# Patient Record
Sex: Female | Born: 1965 | Race: White | Hispanic: No | Marital: Married | Smoking: Former smoker
Health system: Southern US, Community
[De-identification: ages and names within clinical notes are randomized; demographics above are authoritative.]

## PROBLEM LIST (undated history)

## (undated) DIAGNOSIS — E119 Type 2 diabetes mellitus without complications: Secondary | ICD-10-CM

## (undated) DIAGNOSIS — I2510 Atherosclerotic heart disease of native coronary artery without angina pectoris: Secondary | ICD-10-CM

---

## 2000-12-06 HISTORY — PX: PERCUTANEOUS CORONARY STENT INTERVENTION (PCI-S): SHX6016

## 2001-02-15 ENCOUNTER — Encounter: Admission: RE | Admit: 2001-02-15 | Discharge: 2001-02-15 | Payer: Self-pay | Admitting: Otolaryngology

## 2001-02-15 ENCOUNTER — Encounter: Payer: Self-pay | Admitting: Otolaryngology

## 2005-01-17 ENCOUNTER — Emergency Department (HOSPITAL_COMMUNITY): Admission: EM | Admit: 2005-01-17 | Discharge: 2005-01-17 | Payer: Self-pay | Admitting: Emergency Medicine

## 2009-03-10 ENCOUNTER — Inpatient Hospital Stay (HOSPITAL_COMMUNITY): Admission: RE | Admit: 2009-03-10 | Discharge: 2009-03-15 | Payer: Self-pay | Admitting: Neurosurgery

## 2011-03-17 LAB — CBC
MCHC: 34.3 g/dL (ref 30.0–36.0)
MCV: 93 fL (ref 78.0–100.0)
RBC: 3.38 MIL/uL — ABNORMAL LOW (ref 3.87–5.11)
RBC: 3.44 MIL/uL — ABNORMAL LOW (ref 3.87–5.11)
WBC: 10.3 10*3/uL (ref 4.0–10.5)
WBC: 10.8 10*3/uL — ABNORMAL HIGH (ref 4.0–10.5)

## 2011-03-17 LAB — BASIC METABOLIC PANEL
BUN: 6 mg/dL (ref 6–23)
CO2: 31 mEq/L (ref 19–32)
Calcium: 9.3 mg/dL (ref 8.4–10.5)
Chloride: 101 mEq/L (ref 96–112)
Creatinine, Ser: 0.81 mg/dL (ref 0.4–1.2)
GFR calc Af Amer: >60 mL/min (ref >60)
GFR calc non Af Amer: >60 mL/min (ref >60)
Glucose, Bld: 98 mg/dL (ref 70–99)
Potassium: 3.7 mEq/L (ref 3.5–5.1)
Sodium: 139 mEq/L (ref 135–145)

## 2011-03-18 LAB — CBC
HCT: 43.2 % (ref 36.0–46.0)
Hemoglobin: 14.9 g/dL (ref 12.0–15.0)
MCHC: 34.4 g/dL (ref 30.0–36.0)
MCV: 93.5 fL (ref 78.0–100.0)
RBC: 4.62 MIL/uL (ref 3.87–5.11)

## 2011-03-18 LAB — BASIC METABOLIC PANEL
CO2: 26 mEq/L (ref 19–32)
Chloride: 102 mEq/L (ref 96–112)
Creatinine, Ser: 1.06 mg/dL (ref 0.4–1.2)
GFR calc Af Amer: >60 mL/min (ref >60)
Sodium: 136 mEq/L (ref 135–145)

## 2011-04-20 NOTE — Op Note (Signed)
NAME:  Kristin [name], Kristin [name] NO.:  [id number]   MEDICAL RECORD NO.:  [id number]          PATIENT TYPE:  INP   LOCATION:  3029                         FACILITY:  [facility]   PHYSICIAN:  Cristi Loron, M.D.DATE OF BIRTH:  [birth year]   DATE OF PROCEDURE:  03/10/2009  DATE OF DISCHARGE:                               OPERATIVE REPORT   BRIEF HISTORY:  The patient is a 45 year old white female who has  suffered from back and leg pain consistent with neurogenic claudication.  She failed medical management was worked up with a lumbar MRI which  demonstrated the patient had severe disk degeneration and stenosis at L4-  5.  I discussed the various treatment options with the patient including  surgery.  She has weighed the risks, benefits and alternatives of  surgery and decided to proceed with L4-5 decompression, instrumentation  and fusion.   PREOPERATIVE DIAGNOSES:  L4-5 degenerative disk disease, spinal  stenosis, lumbar radiculopathy and lumbago.   POSTOPERATIVE DIAGNOSES:  L4-5 degenerative disk disease, spinal  stenosis, lumbar radiculopathy and lumbago.   PROCEDURES:  Bilateral L4 laminotomies and foraminotomies to decompress  the bilateral L4 as well as the L5 nerve roots; L4-5 posterior lumbar  interbody fusion with local morselized autograft bone and Actifuse bone  graft extender; insertion of L4-5 interbody prosthesis (Capstone PEEK  interbody prosthesis); L4-5 posterior nonsegmental instrumentation with  Legacy titanium screws and rods; L4-5 posterolateral arthrodesis with  local morselized autograft bone and Vitoss bone graft extender.   SURGEON:  Cristi Loron, MD   ASSISTANT:  Coletta Memos, MD   ANESTHESIA:  General endotracheal.   ESTIMATED BLOOD LOSS:  200 mL.   SPECIMENS:  None.   DRAINS:  None.   COMPLICATIONS:  None.   DESCRIPTION OF PROCEDURE:  The patient was brought to the operating room  by the Anesthesia Team.  General endotracheal  anesthesia was induced.  The patient was turned to the prone position on the Wilson frame.  The  lumbosacral region was then prepared with Betadine scrub and Betadine  solution.  Sterile drapes were applied.  I then injected the area to be  incised with Marcaine with epinephrine solution, used a scalpel to make  a linear midline incision over the L4-5 interspace.  I used the  electrocautery performing bilateral subperiosteal dissection exposing  the spinous process of L3, L4 and L5.  We obtained intraoperative  radiograph to confirm our location and then inserted the [facility]  retractor for exposure.   We began the decompression by performing bilateral L4 laminotomies.  I  should mention that because of the patient's severe facet arthropathy  and lateral recess/foraminal stenosis, a wide lateral decompression was  required in excess of what was the work needed to be done for posterior  lumbar interbody fusion, i.e. we performed medial facetectomies and wide  foraminotomies about the bilateral L4 and L5 nerve roots.  This  completed the decompression.   We now turned our attention to the posterior lumbar interbody fusion,  incised the L4-5 intervertebral disk bilaterally with a 15 blade  scalpel.  We performed a partial  intervertebral dissection with the  pituitary forceps and Carlens curettes.  Disk space was collapsed and  spondylotic but we were able to distract it somewhat.  We then used  trial spacers and determined to use 8 x 26 mm Capstone PEEK interbody  prosthesis.  We prefilled the prosthesis with Vitoss bone graft center  as well as local morselized autograft bone.  We obtained the  decompression.  We then inserted the prosthesis into the interspace of  course after retracting the neural structures out of harm's way.  I  should also mention that we filled the remainder of the disk space with  Vitoss bone graft extender, this completed the posterior lumbar  interbody  fusion.   We now turned our attention to the posterior instrumentation.  Under  fluoroscopic guidance, we cannulated the bilateral L4 and L5 pedicles  with a bone probe.  We tapped the pedicles with 5.5 mm tap.  We then  probed inside the tapped pedicles, there were no cortical breeches and  then we inserted a 6.5 x 45 mm tap screws bilaterally at L4-L5 under  fluoroscopic guidance.  We then palpated along the medial aspect of the  L4-L5 pedicles and noticed no cortical breeches and nerve roots were not  injured.  We then connected the unilateral pedicle screw with lordotic  rod.  We compressed, construct and then secured the rod in place with  caps which we tightened appropriately.  This completed the  instrumentation.   We now turned our attention to the posterolateral arthrodesis.  We used  high-speed drill to decorticate the remainder of the L4 pars, the L4-L5  transverse process and the upper L5 lamina.  We then laid a combination  of local morselized autograft bone and Vitoss bone graft extenders over  these corticated posterolateral structures completing the posterolateral  arthrodesis..   We then inspected the thecal sac in bilateral L4 and L5 nerve roots and  noted it well decompressed.  We obtained hemostasis using bipolar  electrocautery.  We irrigated the wound out with bacitracin solution,  then removed the retractor and then reapproximated the patient's  thoracolumbar fascia with interrupted #1 Vicryl suture, subcutaneous  tissue with interrupted 2-0 Vicryl suture and the skin with Steri-Strips  and benzoin.  The wound was then coated with bacitracin ointment.  Sterile dressing was applied.  The drapes were removed and the patient  was subsequently returned to the supine position where she was extubated  by the Anesthesia Team and transported to the postanesthesia care unit  in stable condition.  All sponge, instrument and needle counts were  correct at the end of this  case.      Cristi Loron, M.D.  Electronically Signed     JDJ/MEDQ  D:  03/10/2009  T:  03/11/2009  Job:  [id number]

## 2011-04-20 NOTE — Discharge Summary (Signed)
NAMEEMMAKATE, HYPES NO.:  [id number]   MEDICAL RECORD NO.:  [id number]          PATIENT TYPE:  INP   LOCATION:  3029                         FACILITY:  [facility]   PHYSICIAN:  Coletta Memos, M.D.     DATE OF BIRTH:  06/27/1966   DATE OF ADMISSION:  03/10/2009  DATE OF DISCHARGE:  03/15/2009                               DISCHARGE SUMMARY   ADMITTING DIAGNOSIS:  L4-5 degenerative disk disease and stenosis.   DISCHARGE DIAGNOSIS:  L4-5 degenerative disk disease and stenosis.   PROCEDURE:  L4-5 posterior lumbar interbody arthrodesis with bilateral  cages.   INDICATIONS:  Ms. Whitehurst presented to Dr. Lovell Sheehan with left lower  extremity pain.  She is being given full conservative treatment without  improvement.  MRI showed degenerative disk disease at L4-5 stenosis.  She also had a lumbar radiculopathy.  She agreed and was admitted to the  hospital where she underwent an uncomplicated procedure.  Postoperatively, she has done well.  She has had some problems with  hemorrhoids and will be discharged with [facility] suppositories.  She  also was given Percocet and Valium.  Otherwise, she has done well.  Wound is clean, dry, no signs of infection at discharge.  Tolerating a  regular diet, ambulating well.  She is given instructions, no heavy  lifting, bending, or twisting.  She will call the office to make an  appointment with Dr. Lovell Sheehan in 3-4 weeks.           ______________________________  Coletta Memos, M.D.     KC/MEDQ  D:  03/15/2009  T:  03/16/2009  Job:  [id number]

## 2011-05-25 ENCOUNTER — Ambulatory Visit (HOSPITAL_COMMUNITY)
Admission: RE | Admit: 2011-05-25 | Discharge: 2011-05-25 | Disposition: A | Discharge status: Home / Self Care | Payer: 59 | Source: Ambulatory Visit | Attending: Cardiology | Admitting: Cardiology

## 2011-05-25 DIAGNOSIS — Z9861 Coronary angioplasty status: Secondary | ICD-10-CM | POA: Insufficient documentation

## 2011-05-25 DIAGNOSIS — I2510 Atherosclerotic heart disease of native coronary artery without angina pectoris: Secondary | ICD-10-CM | POA: Insufficient documentation

## 2011-05-25 DIAGNOSIS — I252 Old myocardial infarction: Secondary | ICD-10-CM | POA: Insufficient documentation

## 2011-06-01 NOTE — Cardiovascular Report (Signed)
NAMEMarland Kitchen  ALANNAH, AVERHART NO.:  [id number]  MEDICAL RECORD NO.:  [id number]  LOCATION:  6522                         FACILITY:  [facility]  PHYSICIAN:  Pamella Pert, MD DATE OF BIRTH:  1966-07-10  DATE OF PROCEDURE:  05/25/2011 DATE OF DISCHARGE:  05/25/2011                           CARDIAC CATHETERIZATION   PROCEDURE PERFORMED: 1. Left ventriculography. 2. Selective right and left coronary arteriography.  INDICATIONS:  Ms. Kazaria Gaertner is a pleasant 45 year old female with history of known coronary artery disease and history of inferior wall myocardial infarction, status post angioplasty and stenting to her mid RCA with a non-drug-eluting 3.0 x 13 mm stent in April 2002.  Since then, she has been complaining of chest discomfort, which appeared to be atypical.  She had undergone Lexiscan Cardiolite stress test, in which it showed marked ST-T wave changes of ischemia.  The perfusion imaging study was normal; however, there were marked EKG changes.  Given her atypical chest pain with the abnormal EKG response to [facility], she is now brought to the cardiac catheterization lab for definitive delineation of progression of coronary artery disease.  HEMODYNAMIC DATA:  The left ventricular pressure was 104/9 withe the end- diastolic pressure of 13 mmHg.  Aortic pressure was 99/61 with a mean of 78 mmHg.  There is no pressure gradient across the aortic valve.  ANGIOGRAPHIC DATA:  Left ventricle:  Left ventricular systolic function was normal with the ejection fraction of 60-65% without regional wall motion abnormality.  There is no significant mitral regurgitation.  Right coronary artery:  Right coronary artery is codominant with the circumflex coronary artery.  The previously placed stent in the mid RCA after the origin of RV branch is widely patent.  The PDA and PL branches are widely patent.  Left main coronary artery:  Left main coronary artery is a  large-caliber vessel.  It is smooth and normal.  Circumflex coronary artery:  Circumflex coronary artery is a large- caliber vessel.  It is codominant with right coronary artery.  It gives origin to a small obtuse marginal 1.  The obtuse marginal 2 is a moderate-to-large sized vessel with a ostial 30% stenosis.  Otherwise, the circumflex is smooth and normal.  LAD:  LAD is a large-caliber vessel.  It is smooth and normal.  After the origin of a large diagonal 2, there is a 20-30% focal stenosis which is hazy.  Otherwise, the LAD itself is smooth and normal.  Diagonal 1 is a moderate-sized vessel with an ostial 10% luminal irregularity.  IMPRESSION:  Widely patent right coronary artery stent.  There is very mild luminal irregularity without any significant coronary artery disease.  Normal ejection fraction.  RECOMMENDATIONS:  Continued secondary prophylaxis and prevention is indicated.  The patient will be discharged home today with outpatient followup.  She will need aggressive risk modification, especially smoking cessation, which is very motivated to do so now.  A total of 45 mL of contrast was utilized for diagnostic angiography.  TECHNIQUE OF THE PROCEDURE:  Under sterile precautions using a 5-French right radial access and a 5-French TIG #4 catheter was advanced into the ascending aorta, then into the left ventricle.  Left ventriculography was  performed in the RAO projection.  Catheter pulled into the ascending aorta.  Left main was selectively engaged and angiography was performed, then same catheter was utilized to engage the right coronary artery and angiography was performed.  Catheter then pulled out of the body over a J-wire.  Hemostasis was obtained by applying TR band.  The patient tolerated the procedure well.  No immediate complication noted.     Pamella Pert, MD     JRG/MEDQ  D:  05/25/2011  T:  05/26/2011  Job:  [id number]  cc:   Aida Puffer,  MD  Electronically Signed by Yates Decamp MD on 06/01/2011 10:22:28 AM

## 2011-06-07 ENCOUNTER — Emergency Department (HOSPITAL_COMMUNITY): Payer: No Typology Code available for payment source

## 2011-06-07 ENCOUNTER — Emergency Department (HOSPITAL_COMMUNITY)
Admission: EM | Admit: 2011-06-07 | Discharge: 2011-06-07 | Disposition: A | Discharge status: Home / Self Care | Payer: No Typology Code available for payment source | Attending: Emergency Medicine | Admitting: Emergency Medicine

## 2011-06-07 DIAGNOSIS — I10 Essential (primary) hypertension: Secondary | ICD-10-CM | POA: Insufficient documentation

## 2011-06-07 DIAGNOSIS — M549 Dorsalgia, unspecified: Secondary | ICD-10-CM | POA: Insufficient documentation

## 2011-06-07 DIAGNOSIS — S335XXA Sprain of ligaments of lumbar spine, initial encounter: Secondary | ICD-10-CM | POA: Insufficient documentation

## 2011-06-07 DIAGNOSIS — E785 Hyperlipidemia, unspecified: Secondary | ICD-10-CM | POA: Insufficient documentation

## 2011-06-07 DIAGNOSIS — I2510 Atherosclerotic heart disease of native coronary artery without angina pectoris: Secondary | ICD-10-CM | POA: Insufficient documentation

## 2011-06-07 DIAGNOSIS — R51 Headache: Secondary | ICD-10-CM | POA: Insufficient documentation

## 2011-06-07 DIAGNOSIS — E039 Hypothyroidism, unspecified: Secondary | ICD-10-CM | POA: Insufficient documentation

## 2011-08-06 ENCOUNTER — Other Ambulatory Visit: Payer: Self-pay | Admitting: Family Medicine

## 2011-08-06 DIAGNOSIS — R102 Pelvic and perineal pain: Secondary | ICD-10-CM

## 2011-08-10 ENCOUNTER — Inpatient Hospital Stay (HOSPITAL_COMMUNITY)
Admission: EM | Admit: 2011-08-10 | Discharge: 2011-08-14 | DRG: 392 | Disposition: A | Discharge status: Home / Self Care | Payer: 59 | Attending: Internal Medicine | Admitting: Internal Medicine

## 2011-08-10 ENCOUNTER — Emergency Department (HOSPITAL_COMMUNITY): Payer: 59

## 2011-08-10 DIAGNOSIS — R109 Unspecified abdominal pain: Secondary | ICD-10-CM | POA: Diagnosis present

## 2011-08-10 DIAGNOSIS — A09 Infectious gastroenteritis and colitis, unspecified: Principal | ICD-10-CM | POA: Diagnosis present

## 2011-08-10 DIAGNOSIS — M549 Dorsalgia, unspecified: Secondary | ICD-10-CM | POA: Diagnosis present

## 2011-08-10 DIAGNOSIS — K5909 Other constipation: Secondary | ICD-10-CM | POA: Diagnosis present

## 2011-08-10 DIAGNOSIS — E039 Hypothyroidism, unspecified: Secondary | ICD-10-CM | POA: Diagnosis present

## 2011-08-10 LAB — COMPREHENSIVE METABOLIC PANEL
AST: 19 U/L (ref 0–37)
Alkaline Phosphatase: 112 U/L (ref 39–117)
BUN: 14 mg/dL (ref 6–23)
CO2: 30 mEq/L (ref 19–32)
Chloride: 101 mEq/L (ref 96–112)
Creatinine, Ser: 0.95 mg/dL (ref 0.50–1.10)
GFR calc non Af Amer: >60 mL/min (ref >60)
Potassium: 4 mEq/L (ref 3.5–5.1)
Total Bilirubin: 0.6 mg/dL (ref 0.3–1.2)

## 2011-08-10 LAB — CBC
Hemoglobin: 15 g/dL (ref 12.0–15.0)
MCH: 30.9 pg (ref 26.0–34.0)
MCH: 30.4 pg (ref 26.0–34.0)
MCHC: 33.6 g/dL (ref 30.0–36.0)
MCV: 90.7 fL (ref 78.0–100.0)
MCV: 90.5 fL (ref 78.0–100.0)
Platelets: 165 10*3/uL (ref 150–400)
RBC: 4.86 MIL/uL (ref 3.87–5.11)
RBC: 3.98 MIL/uL (ref 3.87–5.11)
RDW: 12.3 % (ref 11.5–15.5)

## 2011-08-10 LAB — DIFFERENTIAL
Lymphocytes Relative: 38 % (ref 12–46)
Lymphs Abs: 3.4 10*3/uL (ref 0.7–4.0)
Monocytes Relative: 6 % (ref 3–12)
Neutro Abs: 4.7 10*3/uL (ref 1.7–7.7)
Neutrophils Relative %: 54 % (ref 43–77)

## 2011-08-10 LAB — URINALYSIS, ROUTINE W REFLEX MICROSCOPIC
Bilirubin Urine: NEGATIVE
Glucose, UA: NEGATIVE mg/dL
Ketones, ur: NEGATIVE mg/dL
Leukocytes, UA: NEGATIVE
Specific Gravity, Urine: 1.005 (ref 1.005–1.030)
pH: 7 (ref 5.0–8.0)

## 2011-08-10 LAB — PROTIME-INR: Prothrombin Time: 14.6 seconds (ref 11.6–15.2)

## 2011-08-10 MED ORDER — IOHEXOL 300 MG/ML  SOLN
100.0000 mL | Freq: Once | INTRAMUSCULAR | Status: AC | PRN
  Administered 2011-08-10: 100 mL via INTRAVENOUS

## 2011-08-11 LAB — COMPREHENSIVE METABOLIC PANEL
BUN: 9 mg/dL (ref 6–23)
CO2: 26 mEq/L (ref 19–32)
Calcium: 8.8 mg/dL (ref 8.4–10.5)
Creatinine, Ser: 0.93 mg/dL (ref 0.50–1.10)
GFR calc Af Amer: >60 mL/min (ref >60)
GFR calc non Af Amer: >60 mL/min (ref >60)
Glucose, Bld: 95 mg/dL (ref 70–99)
Sodium: 140 mEq/L (ref 135–145)
Total Protein: 5.8 g/dL — ABNORMAL LOW (ref 6.0–8.3)

## 2011-08-11 LAB — CLOSTRIDIUM DIFFICILE BY PCR: Toxigenic C. Difficile by PCR: NEGATIVE

## 2011-08-11 LAB — HIV ANTIBODY (ROUTINE TESTING W REFLEX): HIV: NONREACTIVE

## 2011-08-11 NOTE — H&P (Signed)
NAME:  Kristin [name], LEU NO.:  [id number]  MEDICAL RECORD NO.:  [id number]  LOCATION:  WLED                         FACILITY:  [facility]  PHYSICIAN:  Marinda Elk, M.D.DATE OF BIRTH:  03-26-1966  DATE OF ADMISSION:  08/10/2011 DATE OF DISCHARGE:                             HISTORY & PHYSICAL   PRIMARY CARE DOCTOR:  She does not remember.  CHIEF COMPLAINT:  Abdominal pain and diarrhea.  HISTORY:  This is a 45 year old female with past medical history of tobacco abuse, also back surgery in November 2010, status post cath in June 2012 that showed minimal disease and widely patent stents, who comes in for abdominal pain and rectal pain for the past 3 weeks.  She relates this all started 3 weeks ago.  She went to her primary care doctor and treated her with antibiotic.  This improved after 2 weeks of treatment.  But then after that, it started getting worse to the point where she could not eat well.  On Wednesday, she went to see her primary care doctor.  He did a rectal exam, was exquisitely tender.  She continues to have ongoing diarrhea.  No nausea, vomiting, fevers.  No history of any travel.  No partners in the last 6 months.  No sexual encounters in the 6 months, no sexual anal encounters ever.  No sick contacts.  ALLERGIES:  CODEINE, she gets a break out.  SOCIAL HISTORY:  She continues to smoke.  She did for 30 years, quit about a month ago.  Alcohol abuse, none.  Marijuana as a young girl.  FAMILY HISTORY:  Her father has COPD.  Her mother died at the age of 53 of heart failure.  No family history of Crohn's or ulcerative colitis.  REVIEW OF SYSTEMS:  10-point review of system done, pertinent positive per HPI.  PHYSICAL EXAMINATION:  VITAL SIGNS:  Temperature 97, pulse 62, blood pressure 149/83.  She was satting 100% on room air, breathing 20 times per minute. GENERAL: She is awake, alert, and oriented x3.  She looks older for her age. HEENT:   Moist mucous membrane.  Anicteric.  No pallor. NECK: No JVD.  No bruits.  No thyromegaly. LUNGS:  Good air movement.  Clear to auscultation. CARDIOVASCULAR: She has a regular rate and rhythm with a positive S1 and S2.  No murmurs, rubs, or gallops. ABDOMEN:  Positive bowel sounds, soft, distended, diffusely but mainly on the bilateral lower quadrants, but no rebound. SKIN:  No rashes, ulcerations. NEURO EXAM:  Nonfocal.  LABORATORY DATA:  Labs on admission shows sodium 140, potassium 4.0, chloride 101, bicarb of 30, glucose of 88, BUN of 14, creatinine 0.9. LFTs within normal limits.  Her UA showed no signs of infection.  Her white count is 8.8, hemoglobin of 15, platelet count 206, ANC of 4.7. Her CT scan of the abdomen and pelvis showed mild wall thickening with slight inflammation stranding around the sigmoid colon diffusely, favor infectious versus inflammation.  ASSESSMENT AND PLAN: 1. Abdominal pain and diarrhea.  Recently treated with antibiotics     consistent with a CT scan showed inflammation around the colon.     Concern for Clostridium difficile, was  started on Flagyl.  We will     check a Clostridium difficile, HIV, white blood cell in stool.     Also on the differential includes inflammatory disease like Crohn's     or colitis.  If no improvement and Clostridium difficile negative,     we will call GI for a colonoscopy. 2. Back pain.  Continue narcotics.     Marinda Elk, M.D.     AF/MEDQ  D:  08/10/2011  T:  08/10/2011  Job:  [id number]  Electronically Signed by Marinda Elk M.D. on 08/11/2011 08:34:51 AM

## 2011-08-13 LAB — DIFFERENTIAL
Basophils Absolute: 0 10*3/uL (ref 0.0–0.1)
Basophils Relative: 0 % (ref 0–1)
Eosinophils Absolute: 0.1 10*3/uL (ref 0.0–0.7)
Monocytes Relative: 6 % (ref 3–12)
Neutro Abs: 3.1 10*3/uL (ref 1.7–7.7)
Neutrophils Relative %: 56 % (ref 43–77)

## 2011-08-13 LAB — BASIC METABOLIC PANEL
CO2: 25 mEq/L (ref 19–32)
Calcium: 9.1 mg/dL (ref 8.4–10.5)
GFR calc non Af Amer: >60 mL/min (ref >60)
Glucose, Bld: 106 mg/dL — ABNORMAL HIGH (ref 70–99)
Potassium: 3.5 mEq/L (ref 3.5–5.1)
Sodium: 141 mEq/L (ref 135–145)

## 2011-08-13 LAB — CBC
Hemoglobin: 11.8 g/dL — ABNORMAL LOW (ref 12.0–15.0)
MCH: 30.6 pg (ref 26.0–34.0)
Platelets: 147 10*3/uL — ABNORMAL LOW (ref 150–400)
RBC: 3.86 MIL/uL — ABNORMAL LOW (ref 3.87–5.11)

## 2011-08-14 ENCOUNTER — Other Ambulatory Visit: Payer: Self-pay | Admitting: Gastroenterology

## 2011-08-16 NOTE — Discharge Summary (Signed)
NAME:  Kristin [name], PETRUCCI NO.:  [id number]  MEDICAL RECORD NO.:  [id number]  LOCATION:  1512                         FACILITY:  [facility]  PHYSICIAN:  Brendia Sacks, MD    DATE OF BIRTH:  [birth year]  DATE OF ADMISSION:  08/10/2011 DATE OF DISCHARGE:  08/14/2011                              DISCHARGE SUMMARY   PRIMARY CARE PHYSICIAN:  Fayrene Fearing C. Little, MD  CONDITION ON DISCHARGE:  Improved.  PRIMARY GASTROENTEROLOGIST:  Everardo All. Madilyn Fireman, MD  DISCHARGE DIAGNOSES: 1. Colitis, infectious versus inflammatory. 2. Hypothyroidism, stable. 3. History of constipation.  HISTORY OF PRESENT ILLNESS:  This is a 45 year old woman who presented to the emergency room with abdominal pain and rectal pain.  She was admitted for further evaluation and treatment.  HOSPITAL COURSE:  Kristin [name] was admitted to the medical floor and treated empirically for infectious colitis.  Her CT suggested mild wall thickening with slight inflammatory stranding around the sigmoid colon diffusely; infectious or inflammatory etiology was suspected.  She was placed on Cipro and Flagyl and today is feeling much better.  Because of her history of symptoms for approximately a month, Gastroenterology was consulted and a flexible sigmoidoscopy was performed which was fairly unrevealing.  Biopsies were taken by Dr. Madilyn Fireman.  The patient is feeling much better after the procedure and requests to be discharged home today.  She is tolerating a diet and feels much better.  In regard to her hypothyroidism, her TSH was noted be elevated and her Synthroid dose has been increased.  She tells me that it has not recently been adjusted in the outpatient setting.  CONSULTATIONS:  Gastroenterology.  Recommendations as above.  PROCEDURES:  Flexible sigmoidoscopy which was reported to be negative to 30 cm with some solid stool and biopsy.  MICROBIOLOGY:  None.  PERTINENT LABORATORY STUDIES: 1. TSH was 18.253. 2.  HIV-antibody was nonreactive. 3. Clostridium difficile probe by PCR was negative. 4. Laboratory studies were essentially unremarkable including CBC,     basic metabolic panel, and hepatic function panel.  EXAM ON DISCHARGE:  GENERAL:  The patient is feeling well.  No abdominal pain.  She is tolerating diet and is ready to go home.  VITAL SIGNS: Temperature is 99.0, pulse 55, respirations 18, blood pressure 146/70, saturating 99% on room air.  CARDIOVASCULAR:  Regular rate and rhythm. No murmur, rub or gallop.  RESPIRATORY:  Clear to auscultation bilaterally.  No wheezes, rales or rhonchi.  Normal respiratory effort.  DISCHARGE INSTRUCTIONS:  The patient will be discharged home today. Diet is unrestricted.  Activity is unrestricted.  Follow up with her primary care physician, Dr. Aida Puffer, in approximately 2 weeks. Follow up with Dr. Dorena Cookey as needed.  DISCHARGE MEDICATIONS: 1. Cipro 500 mg p.o. b.i.d. 2. Flagyl 500 mg p.o. q.i.d.  RESUME HOME MEDICATIONS: 1. Albuterol inhaler 2 puffs every 4 hours as needed for shortness of     breath. 2. Aspirin 81 mg 4 tablets p.o. daily. 3. Cetirizine 10 mg p.o. daily. 4. Diazepam 5 mg p.o. at bedtime. 5. Enalapril 20 mg p.o. daily. 6. Metoprolol 50 mg p.o. b.i.d. 7. Nitroglycerin sublingual 0.4 mg every 5 minutes as needed for chest  pain up to three doses. 8. Omeprazole 20 mg b.i.d. 9. Os-Cal 1 tablet b.i.d. with omeprazole. 10.Oxycodone 5 mg every 4 to 6 hours as needed for pain. 11.Premarin vaginally 2 to 3 times a week. 12.Simvastatin 20 mg p.o. daily. 13.Synthroid has been increased to 88 mcg p.o. daily.  DISCONTINUE MiraLAX.  Time coordinating discharge, 25 minutes.     Brendia Sacks, MD     DG/MEDQ  D:  08/14/2011  T:  08/14/2011  Job:  [id number]  cc:   Winn Jock. Little, MD [address] [place], [place] [address]  John C. Madilyn Fireman, M.D. Fax: [phone]  Electronically Signed by Brendia Sacks  on 08/16/2011 09:49:37  PM

## 2011-08-25 NOTE — Consult Note (Signed)
  NAME:  Kristin [name], Kristin [name] NO.:  [id number]  MEDICAL RECORD NO.:  [id number]  LOCATION:                               FACILITY:  [facility]  PHYSICIAN:  Shirley Friar, MDDATE OF BIRTH:  18-Dec-1965  DATE OF CONSULTATION: DATE OF DISCHARGE:                                CONSULTATION   REQUESTING PHYSICIAN:  Dr. Irene Limbo.  INDICATION:  Diarrhea.  HISTORY OF PRESENT ILLNESS:  Kristin [name] is a 45-year white female, who comes in with 3 weeks of watery diarrhea that is occurring several times per day and lower abdominal pain when she normally has chronic constipation.  She did get a course of antibiotics prior to the onset of the diarrhea.  She denies any associated rectal bleeding.  She had an episode of nausea, vomiting yesterday, but denies it prior to that.  C. diff PCR was negative.  PAST MEDICAL HISTORY: 1. Back surgery in 2010 with back pain. 2. History of tobacco abuse. 3. Coronary artery disease, status post coronary artery stent.  CURRENT MEDICINES:  Cipro, Flagyl, Vasotec, Premarin, Valium, subcu heparin, levothyroxine, Lopressor, Os-Cal, Protonix, Zocor.  Doses listed in the hospital record and additional medicines given p.r.n.  ALLERGIES:  CODEINE.  FAMILY HISTORY:  Noncontributory.  SOCIAL HISTORY:  Denies alcohol use, positive tobacco.  REVIEW OF SYSTEMS:  Negative from GI standpoint except as stated above.  PHYSICAL EXAMINATION:  VITAL SIGNS:  Temperature 98.8, pulse 91, blood pressure 135/75. GENERAL:  Alert, no acute distress, well nourished. ABDOMEN:  Ecchymosis noted on lower abdomen, soft, minimal suprapubic tenderness without guarding, nondistended, positive bowel sounds.  LABORATORY DATA:  White blood count 5.6, hemoglobin 11.8, platelet count 147.  IMPRESSION:  A 45 year old white female with chronic watery diarrhea with a negative Clostridium difficile PCR test.  Her diarrhea was preceded by a course of antibiotics and this may  be an antibiotic induced diarrhea versus a post infectious irritable bowel syndrome.  Abrupt change from constipation chronically to diarrhea, ejection is also still in the differential.  We will need to do a sigmoidoscopy without prep, but with sedation to further evaluate her symptoms, and this is planned to be done on August 14, 2011.  NPO after midnight for flexible sigmoidoscopy.     Shirley Friar, MD     VCS/MEDQ  D:  08/13/2011  T:  08/14/2011  Job:  [id number]  Electronically Signed by Charlott Rakes MD on 08/25/2011 11:06:21 AM

## 2011-08-29 NOTE — Op Note (Signed)
  NAMEMarland Kitchen  Kristin, [name] NO.:  [id number]  MEDICAL RECORD NO.:  [id number]  LOCATION:  1512                         FACILITY:  [facility]  PHYSICIAN:  Jamani Eley C. Madilyn Fireman, M.D.    DATE OF BIRTH:  1966-04-07  DATE OF PROCEDURE:  08/14/2011 DATE OF DISCHARGE:  08/14/2011                              OPERATIVE REPORT   PROCEDURE PERFORMED:  Flexible sigmoidoscopy with biopsy.  INDICATIONS FOR PROCEDURE:  Diarrhea and abdominal pain.  PROCEDURE IN DETAIL:  The patient was placed in the left lateral decubitus position and placed on the pulse monitor with continuous low- flow oxygen delivered by nasal cannula.  She was sedated with 100 mcg IV fentanyl and 8 mg IV Versed.  Olympus video colonoscope was inserted into the rectum and advanced to the mid sigmoid colon.  No prep had been given and there was some solid stool in the rectum and rectosigmoid, but for the most part, I could see most of the mucosa.  There was a sharp angulation at about 35 cm.  There are no evidence of any mucosal abnormalities.  I elected not to pass the scope beyond it.  The mucosa throughout the colon appeared normal with no masses, polyps, diverticula, or visible inflammatory changes.  Biopsies were taken of the sigmoid colon to rule out any sort of microscopic colitis.  The scope was then withdrawn and the patient returned to the recovery room in stable condition.  She tolerated the procedure well.  There were no immediate complications.  IMPRESSION:  Normal study with some stool in the rectal vault.  PLAN:  Await biopsy results and will advance diet as tolerated.          ______________________________ Everardo All. Madilyn Fireman, M.D.     JCH/MEDQ  D:  08/14/2011  T:  08/14/2011  Job:  [id number]  Electronically Signed by Dorena Cookey M.D. on 08/29/2011 11:40:29 AM
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
## 2015-07-15 ENCOUNTER — Inpatient Hospital Stay (HOSPITAL_COMMUNITY)
Admission: EM | Admit: 2015-07-15 | Discharge: 2015-07-16 | DRG: 246 | Disposition: A | Discharge status: Home / Self Care | Payer: 59 | Attending: Cardiology | Admitting: Cardiology

## 2015-07-15 ENCOUNTER — Encounter (HOSPITAL_COMMUNITY)
Admission: EM | Disposition: A | Discharge status: Home / Self Care | Payer: Self-pay | Source: Home / Self Care | Attending: Cardiology

## 2015-07-15 ENCOUNTER — Encounter (HOSPITAL_COMMUNITY): Payer: Self-pay

## 2015-07-15 ENCOUNTER — Other Ambulatory Visit: Payer: Self-pay

## 2015-07-15 DIAGNOSIS — J449 Chronic obstructive pulmonary disease, unspecified: Secondary | ICD-10-CM | POA: Diagnosis present

## 2015-07-15 DIAGNOSIS — I252 Old myocardial infarction: Secondary | ICD-10-CM | POA: Diagnosis not present

## 2015-07-15 DIAGNOSIS — I10 Essential (primary) hypertension: Secondary | ICD-10-CM | POA: Diagnosis present

## 2015-07-15 DIAGNOSIS — I2119 ST elevation (STEMI) myocardial infarction involving other coronary artery of inferior wall: Secondary | ICD-10-CM

## 2015-07-15 DIAGNOSIS — Z885 Allergy status to narcotic agent status: Secondary | ICD-10-CM

## 2015-07-15 DIAGNOSIS — E785 Hyperlipidemia, unspecified: Secondary | ICD-10-CM | POA: Diagnosis present

## 2015-07-15 DIAGNOSIS — F17210 Nicotine dependence, cigarettes, uncomplicated: Secondary | ICD-10-CM | POA: Diagnosis present

## 2015-07-15 DIAGNOSIS — I2111 ST elevation (STEMI) myocardial infarction involving right coronary artery: Secondary | ICD-10-CM | POA: Diagnosis present

## 2015-07-15 DIAGNOSIS — T82858A Stenosis of vascular prosthetic devices, implants and grafts, initial encounter: Principal | ICD-10-CM | POA: Diagnosis present

## 2015-07-15 DIAGNOSIS — E1165 Type 2 diabetes mellitus with hyperglycemia: Secondary | ICD-10-CM | POA: Diagnosis present

## 2015-07-15 DIAGNOSIS — I213 ST elevation (STEMI) myocardial infarction of unspecified site: Secondary | ICD-10-CM

## 2015-07-15 DIAGNOSIS — I2129 ST elevation (STEMI) myocardial infarction involving other sites: Secondary | ICD-10-CM | POA: Diagnosis present

## 2015-07-15 DIAGNOSIS — R0789 Other chest pain: Secondary | ICD-10-CM | POA: Diagnosis present

## 2015-07-15 DIAGNOSIS — I2510 Atherosclerotic heart disease of native coronary artery without angina pectoris: Secondary | ICD-10-CM | POA: Diagnosis present

## 2015-07-15 HISTORY — PX: CARDIAC CATHETERIZATION: SHX172

## 2015-07-15 HISTORY — DX: Atherosclerotic heart disease of native coronary artery without angina pectoris: I25.10

## 2015-07-15 HISTORY — DX: Type 2 diabetes mellitus without complications: E11.9

## 2015-07-15 LAB — LIPID PANEL
CHOLESTEROL: 192 mg/dL (ref 0–200)
HDL: 65 mg/dL (ref >40)
LDL Cholesterol: 106 mg/dL — ABNORMAL HIGH (ref 0–99)
Total CHOL/HDL Ratio: 3 RATIO
Triglycerides: 105 mg/dL (ref <150)
VLDL: 21 mg/dL (ref 0–40)

## 2015-07-15 LAB — GLUCOSE, CAPILLARY
GLUCOSE-CAPILLARY: 181 mg/dL — AB (ref 65–99)
Glucose-Capillary: 166 mg/dL — ABNORMAL HIGH (ref 65–99)
Glucose-Capillary: 104 mg/dL — ABNORMAL HIGH (ref 65–99)

## 2015-07-15 LAB — DIFFERENTIAL
BASOS PCT: 0 % (ref 0–1)
Basophils Absolute: 0 10*3/uL (ref 0.0–0.1)
EOS ABS: 0.2 10*3/uL (ref 0.0–0.7)
EOS PCT: 2 % (ref 0–5)
LYMPHS PCT: 43 % (ref 12–46)
Lymphs Abs: 4.3 10*3/uL — ABNORMAL HIGH (ref 0.7–4.0)
MONO ABS: 0.5 10*3/uL (ref 0.1–1.0)
Monocytes Relative: 5 % (ref 3–12)
NEUTROS ABS: 5 10*3/uL (ref 1.7–7.7)
Neutrophils Relative %: 50 % (ref 43–77)

## 2015-07-15 LAB — BASIC METABOLIC PANEL
Anion gap: 11 (ref 5–15)
BUN: 9 mg/dL (ref 6–20)
CO2: 23 mmol/L (ref 22–32)
Calcium: 8.6 mg/dL — ABNORMAL LOW (ref 8.9–10.3)
Chloride: 103 mmol/L (ref 101–111)
Creatinine, Ser: 1.14 mg/dL — ABNORMAL HIGH (ref 0.44–1.00)
GFR calc Af Amer: >60 mL/min (ref >60)
GFR calc non Af Amer: 55 mL/min — ABNORMAL LOW (ref >60)
GLUCOSE: 184 mg/dL — AB (ref 65–99)
POTASSIUM: 4.3 mmol/L (ref 3.5–5.1)
Sodium: 137 mmol/L (ref 135–145)

## 2015-07-15 LAB — TROPONIN I: Troponin I: 33.13 ng/mL (ref <0.031)

## 2015-07-15 LAB — CBC
HCT: 41.9 % (ref 36.0–46.0)
Hemoglobin: 14 g/dL (ref 12.0–15.0)
MCH: 29 pg (ref 26.0–34.0)
MCHC: 33.4 g/dL (ref 30.0–36.0)
MCV: 86.9 fL (ref 78.0–100.0)
Platelets: 227 10*3/uL (ref 150–400)
RBC: 4.82 MIL/uL (ref 3.87–5.11)
RDW: 14.4 % (ref 11.5–15.5)
WBC: 10 10*3/uL (ref 4.0–10.5)

## 2015-07-15 LAB — PROTIME-INR
INR: 1.08 (ref 0.00–1.49)
Prothrombin Time: 14.2 seconds (ref 11.6–15.2)

## 2015-07-15 LAB — MRSA PCR SCREENING: MRSA by PCR: NEGATIVE

## 2015-07-15 LAB — I-STAT TROPONIN, ED: Troponin i, poc: 0.06 ng/mL (ref 0.00–0.08)

## 2015-07-15 LAB — TSH: TSH: 1.13 u[IU]/mL (ref 0.350–4.500)

## 2015-07-15 LAB — APTT: aPTT: 26 seconds (ref 24–37)

## 2015-07-15 SURGERY — LEFT HEART CATH AND CORONARY ANGIOGRAPHY
Anesthesia: LOCAL

## 2015-07-15 MED ORDER — BIVALIRUDIN BOLUS VIA INFUSION - CUPID
INTRAVENOUS | Status: DC | PRN
  Administered 2015-07-15: 37.05 mg via INTRAVENOUS

## 2015-07-15 MED ORDER — PRASUGREL HCL 10 MG PO TABS
ORAL_TABLET | ORAL | Status: AC
Start: 2015-07-15 — End: 2015-07-15
  Filled 2015-07-15: qty 5

## 2015-07-15 MED ORDER — SODIUM CHLORIDE 0.9 % WEIGHT BASED INFUSION
3.0000 mL/kg/h | INTRAVENOUS | Status: AC
  Administered 2015-07-15: 3 mL/kg/h via INTRAVENOUS

## 2015-07-15 MED ORDER — FENTANYL CITRATE (PF) 100 MCG/2ML IJ SOLN
INTRAMUSCULAR | Status: DC | PRN
  Administered 2015-07-15: 50 ug via INTRAVENOUS

## 2015-07-15 MED ORDER — PRASUGREL HCL 10 MG PO TABS
ORAL_TABLET | ORAL | Status: DC | PRN
  Administered 2015-07-15: 60 mg via ORAL

## 2015-07-15 MED ORDER — INSULIN ASPART 100 UNIT/ML ~~LOC~~ SOLN
0.0000 [IU] | Freq: Three times a day (TID) | SUBCUTANEOUS | Status: DC
  Administered 2015-07-15: 2 [IU] via SUBCUTANEOUS
  Administered 2015-07-16: 1 [IU] via SUBCUTANEOUS

## 2015-07-15 MED ORDER — DIAZEPAM 5 MG PO TABS
5.0000 mg | ORAL_TABLET | Freq: Four times a day (QID) | ORAL | Status: DC | PRN
  Administered 2015-07-15: 5 mg via ORAL
  Filled 2015-07-15: qty 1

## 2015-07-15 MED ORDER — BIVALIRUDIN 250 MG IV SOLR
INTRAVENOUS | Status: AC
  Filled 2015-07-15: qty 250

## 2015-07-15 MED ORDER — HEPARIN SODIUM (PORCINE) 5000 UNIT/ML IJ SOLN
INTRAMUSCULAR | Status: AC
  Filled 2015-07-15: qty 1

## 2015-07-15 MED ORDER — FENTANYL CITRATE (PF) 100 MCG/2ML IJ SOLN
25.0000 ug | INTRAMUSCULAR | Status: DC | PRN
  Administered 2015-07-15 (×2): 50 ug via INTRAVENOUS
  Filled 2015-07-15 (×2): qty 2

## 2015-07-15 MED ORDER — NITROGLYCERIN 1 MG/10 ML FOR IR/CATH LAB
INTRA_ARTERIAL | Status: AC
  Filled 2015-07-15: qty 10

## 2015-07-15 MED ORDER — ASPIRIN 81 MG PO CHEW
81.0000 mg | CHEWABLE_TABLET | Freq: Every day | ORAL | Status: DC
  Administered 2015-07-16: 81 mg via ORAL
  Filled 2015-07-15: qty 1

## 2015-07-15 MED ORDER — IOHEXOL 350 MG/ML SOLN
INTRAVENOUS | Status: DC | PRN
  Administered 2015-07-15: 75 mL via INTRACARDIAC

## 2015-07-15 MED ORDER — ATORVASTATIN CALCIUM 80 MG PO TABS
80.0000 mg | ORAL_TABLET | Freq: Every day | ORAL | Status: DC
  Administered 2015-07-15: 80 mg via ORAL
  Filled 2015-07-15 (×2): qty 1

## 2015-07-15 MED ORDER — LIDOCAINE HCL (PF) 1 % IJ SOLN
INTRAMUSCULAR | Status: AC
  Filled 2015-07-15: qty 30

## 2015-07-15 MED ORDER — ONDANSETRON HCL 4 MG/2ML IJ SOLN
INTRAMUSCULAR | Status: DC | PRN
Start: 2015-07-15 — End: 2015-07-15
  Administered 2015-07-15: 4 mg via INTRAVENOUS

## 2015-07-15 MED ORDER — SODIUM CHLORIDE 0.9 % IJ SOLN: 3.0000 mL | INTRAMUSCULAR | Status: DC | PRN

## 2015-07-15 MED ORDER — LIDOCAINE HCL (PF) 1 % IJ SOLN
INTRAMUSCULAR | Status: DC | PRN
  Administered 2015-07-15: 15:00:00

## 2015-07-15 MED ORDER — BIVALIRUDIN 250 MG IV SOLR
250.0000 mg | INTRAVENOUS | Status: DC | PRN
  Administered 2015-07-15: 1.75 mg/kg/h via INTRAVENOUS

## 2015-07-15 MED ORDER — SODIUM CHLORIDE 0.9 % IV SOLN: 250.0000 mL | INTRAVENOUS | Status: DC | PRN

## 2015-07-15 MED ORDER — PRASUGREL HCL 10 MG PO TABS
10.0000 mg | ORAL_TABLET | ORAL | Status: DC
  Filled 2015-07-15: qty 1

## 2015-07-15 MED ORDER — FENTANYL CITRATE (PF) 100 MCG/2ML IJ SOLN
INTRAMUSCULAR | Status: AC
  Filled 2015-07-15: qty 2

## 2015-07-15 MED ORDER — ZOLPIDEM TARTRATE 5 MG PO TABS: 5.0000 mg | ORAL_TABLET | Freq: Every evening | ORAL | Status: DC | PRN

## 2015-07-15 MED ORDER — ONDANSETRON HCL 4 MG/2ML IJ SOLN
INTRAMUSCULAR | Status: AC
  Filled 2015-07-15: qty 2

## 2015-07-15 MED ORDER — MIDAZOLAM HCL 2 MG/2ML IJ SOLN
INTRAMUSCULAR | Status: AC
  Filled 2015-07-15: qty 4

## 2015-07-15 MED ORDER — ACETAMINOPHEN 325 MG PO TABS
650.0000 mg | ORAL_TABLET | ORAL | Status: DC | PRN
  Administered 2015-07-16: 650 mg via ORAL
  Filled 2015-07-15: qty 2

## 2015-07-15 MED ORDER — HEPARIN SODIUM (PORCINE) 5000 UNIT/ML IJ SOLN
3000.0000 [IU] | Freq: Once | INTRAMUSCULAR | Status: AC
  Administered 2015-07-15: 3000 [IU] via INTRAVENOUS

## 2015-07-15 MED ORDER — HEPARIN (PORCINE) IN NACL 2-0.9 UNIT/ML-% IJ SOLN
INTRAMUSCULAR | Status: AC
  Filled 2015-07-15: qty 2000

## 2015-07-15 MED ORDER — METOPROLOL TARTRATE 25 MG PO TABS
25.0000 mg | ORAL_TABLET | Freq: Two times a day (BID) | ORAL | Status: DC
  Administered 2015-07-16: 25 mg via ORAL
  Filled 2015-07-15 (×2): qty 1

## 2015-07-15 MED ORDER — FENTANYL CITRATE (PF) 100 MCG/2ML IJ SOLN
INTRAMUSCULAR | Status: AC
  Filled 2015-07-15: qty 4

## 2015-07-15 MED ORDER — ONDANSETRON HCL 4 MG/2ML IJ SOLN
4.0000 mg | Freq: Four times a day (QID) | INTRAMUSCULAR | Status: DC | PRN
  Administered 2015-07-15: 4 mg via INTRAVENOUS
  Filled 2015-07-15: qty 2

## 2015-07-15 MED ORDER — PRASUGREL HCL 10 MG PO TABS
ORAL_TABLET | ORAL | Status: AC
  Filled 2015-07-15: qty 1

## 2015-07-15 MED ORDER — SODIUM CHLORIDE 0.9 % IJ SOLN
3.0000 mL | Freq: Two times a day (BID) | INTRAMUSCULAR | Status: DC
  Administered 2015-07-15 – 2015-07-16 (×2): 3 mL via INTRAVENOUS

## 2015-07-15 SURGICAL SUPPLY — 19 items
BALLN EUPHORA RX 2.0X15 (BALLOONS) ×2
BALLN ~~LOC~~ TREK RX 3.25X20 (BALLOONS) ×1 IMPLANT
BALLOON EUPHORA RX 2.0X15 (BALLOONS) IMPLANT
CATH INFINITI 5FR MPB2 (CATHETERS) ×1 IMPLANT
CATH OPTITORQUE TIG 4.0 5F (CATHETERS) ×2 IMPLANT
CATH VISTA GUIDE 6FR JR4 (CATHETERS) ×1 IMPLANT
DEVICE CLOSURE PERCLS PRGLD 6F (VASCULAR PRODUCTS) IMPLANT
GLIDESHEATH SLEND A-KIT 6F 20G (SHEATH) ×2 IMPLANT
KIT ENCORE 26 ADVANTAGE (KITS) ×1 IMPLANT
KIT HEART LEFT (KITS) ×2 IMPLANT
PACK CARDIAC CATHETERIZATION (CUSTOM PROCEDURE TRAY) ×2 IMPLANT
PERCLOSE PROGLIDE 6F (VASCULAR PRODUCTS) ×4
SHEATH PINNACLE 6F 10CM (SHEATH) ×1 IMPLANT
STENT SYNERGY DES 3X32 (Permanent Stent) ×1 IMPLANT
TRANSDUCER W/STOPCOCK (MISCELLANEOUS) ×2 IMPLANT
TUBING CIL FLEX 10 FLL-RA (TUBING) ×2 IMPLANT
WIRE COUGAR XT STRL 190CM (WIRE) ×1 IMPLANT
WIRE EMERALD 3MM-J .035X150CM (WIRE) ×2 IMPLANT
WIRE SAFE-T 1.5MM-J .035X260CM (WIRE) ×2 IMPLANT

## 2015-07-15 NOTE — ED Provider Notes (Signed)
CSN: [id number]     Arrival date & time 07/15/15  1356 History   First MD Initiated Contact with Patient 07/15/15 1404     Chief Complaint  Patient presents with  . Chest Pain     The history is provided by the patient and the EMS personnel. No language interpreter was used.   Ms. Meece presents by EMS for evaluation of chest pain. 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 Radiated to her jaw. EMS was called and she received aspirin and nitroglycerin in route. Per EMS she was noted to be pale and diaphoretic with heart rate in the low 40s. She only had minimal improvement in her pain after the nitroglycerin. She has a history of coronary artery disease status post stenting in 10 years ago in [place]. She takes daily aspirin.  Past Medical History  Diagnosis Date  . Coronary artery disease   . Diabetes mellitus without complication    History reviewed. No pertinent past surgical history. No family history on file. History  Substance Use Topics  . Smoking status: Current Every Day Smoker    Types: Cigarettes  . Smokeless tobacco: Not on file  . Alcohol Use: No   OB History    No data available     Review of Systems  All other systems reviewed and are negative.     Allergies  Morphine and related  Home Medications   Prior to Admission medications   Not on File   There were no vitals taken for this visit. Physical Exam  Constitutional: She is oriented to person, place, and time. She appears well-developed and well-nourished.  HENT:  Head: Normocephalic and atraumatic.  Cardiovascular: Regular rhythm.   No murmur heard. Bradycardic  Pulmonary/Chest: Effort normal and breath sounds normal. No respiratory distress.  Abdominal: Soft. There is no tenderness. There is no rebound and no guarding.  Musculoskeletal: She exhibits no edema or tenderness.  Neurological: She is alert and oriented to person, place, and time.  Skin: Skin is  dry.  Cool and pale skin  Psychiatric: She has a normal mood and affect. Her behavior is normal.  Nursing note and vitals reviewed.   ED Course  Procedures (including critical care time) Labs Review Labs Reviewed  DIFFERENTIAL - Abnormal; Notable for the following:    Lymphs Abs 4.3 (*)    All other components within normal limits  BASIC METABOLIC PANEL - Abnormal; Notable for the following:    Glucose, Bld 184 (*)    Creatinine, Ser 1.14 (*)    Calcium 8.6 (*)    GFR calc non Af Amer 55 (*)    All other components within normal limits  CBC  PROTIME-INR  APTT  LIPID PANEL  TSH  HEMOGLOBIN A1C  I-STAT TROPOININ, ED    Imaging Review No results found.   EKG Interpretation None      MDM   Final diagnoses:  ST elevation myocardial infarction (STEMI), unspecified artery    Patient here with chest pain, has history of CAD, EKG consistent with acute inferior ST elevation MI. Cath lab activated on patient's ED arrival. Heparin given and patient transferred to cath lab for emergent intervention. Patient did receive aspirin prior to ED arrival by EMS.    Tilden Fossa, MD 07/15/15 [phone]
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
## 2015-07-15 NOTE — H&P (Signed)
Kristin [name] is an 49 y.o. female.   Chief Complaint: Chest pain HPI: Kristin [name]  is a 49 y.o. female  With history of known coronary artery disease, history of inferior myocardial infarction with mid RCA stent implantation, 3.0 x 13 mm non-DES in April 2002, history of tobacco use disorder had sudden onset of severe chest discomfort, she immediately activated the EMS. She was brought to the [facility] emergency room where she was found to have ST segment elevation in the inferior leads with reciprocal ST depression in the anterior leads. With a diagnosis of acute ST elevation myocardial infarctions was emergently to the cardiac catheterization lab.  Chest pain associated with marked nausea and diaphoresis. Patient has not seen a cardiologist in many years, not on any statins.  Past Medical History  Diagnosis Date  . Coronary artery disease   . Diabetes mellitus without complication     History reviewed. No pertinent past surgical history.  No family history on file. Social History:  reports that she has been smoking Cigarettes.  She does not have any smokeless tobacco history on file. She reports that she does not drink alcohol. Her drug history is not on file.  Allergies:  Allergies  Allergen Reactions  . Morphine And Related     Hives and itching    Review of Systems - Negative except Chest pain and shortness of breath. History of diabetes mellitus  Blood pressure 133/85, pulse 54, temperature 96.4 F (35.8 C), temperature source Temporal, resp. rate 18, height $RemoveBe'5\' 2"'OkrtlYkdb$  (1.575 m), weight 49.442 kg (109 lb), SpO2 100 %. General appearance: alert, appears older than stated age, no distress and Petite. Eyes: negative findings: lids and lashes normal Neck: no carotid bruit, no JVD, supple, symmetrical, trachea midline and thyroid not enlarged, symmetric, no tenderness/mass/nodules Neck: JVP - normal, carotids 2+= without bruits Resp: clear to auscultation bilaterally and  Bell-shaped chest, distant lung sounds, no obvious crackles or wheezes heard. Chest wall: no tenderness Cardio: regular rate and rhythm, S1, S2 normal, no murmur, click, rub or gallop and Distant heart sounds GI: soft, non-tender; bowel sounds normal; no masses,  no organomegaly Extremities: extremities normal, atraumatic, no cyanosis or edema Pulses: Carotids no bruit, femoral pulses 2+ bilateral, popliteal and pedal pulses not felt. Skin: Skin color, texture, turgor normal. No rashes or lesions Neurologic: Grossly normal  Results for orders placed or performed during the hospital encounter of 07/15/15 (from the past 48 hour(s))  CBC     Status: None   Collection Time: 07/15/15  2:05 PM  Result Value Ref Range   WBC 10.0 4.0 - 10.5 K/uL   RBC 4.82 3.87 - 5.11 MIL/uL   Hemoglobin 14.0 12.0 - 15.0 g/dL   HCT 41.9 36.0 - 46.0 %   MCV 86.9 78.0 - 100.0 fL   MCH 29.0 26.0 - 34.0 pg   MCHC 33.4 30.0 - 36.0 g/dL   RDW 14.4 11.5 - 15.5 %   Platelets 227 150 - 400 K/uL  Differential     Status: Abnormal   Collection Time: 07/15/15  2:05 PM  Result Value Ref Range   Neutrophils Relative % 50 43 - 77 %   Neutro Abs 5.0 1.7 - 7.7 K/uL   Lymphocytes Relative 43 12 - 46 %   Lymphs Abs 4.3 (H) 0.7 - 4.0 K/uL   Monocytes Relative 5 3 - 12 %   Monocytes Absolute 0.5 0.1 - 1.0 K/uL   Eosinophils Relative 2 0 -  5 %   Eosinophils Absolute 0.2 0.0 - 0.7 K/uL   Basophils Relative 0 0 - 1 %   Basophils Absolute 0.0 0.0 - 0.1 K/uL  Basic metabolic panel     Status: Abnormal   Collection Time: 07/15/15  2:05 PM  Result Value Ref Range   Sodium 137 135 - 145 mmol/L   Potassium 4.3 3.5 - 5.1 mmol/L   Chloride 103 101 - 111 mmol/L   CO2 23 22 - 32 mmol/L   Glucose, Bld 184 (H) 65 - 99 mg/dL   BUN 9 6 - 20 mg/dL   Creatinine, Ser 1.14 (H) 0.44 - 1.00 mg/dL   Calcium 8.6 (L) 8.9 - 10.3 mg/dL   GFR calc non Af Amer 55 (L) >60 mL/min   GFR calc Af Amer >60 >60 mL/min    Comment: (NOTE) The eGFR has  been calculated using the CKD EPI equation. This calculation has not been validated in all clinical situations. eGFR's persistently <60 mL/min signify possible Chronic Kidney Disease.    Anion gap 11 5 - 15  I-Stat Troponin, ED (not at [facility])     Status: None   Collection Time: 07/15/15  2:13 PM  Result Value Ref Range   Troponin i, poc 0.06 0.00 - 0.08 ng/mL   Comment 3            Comment: Due to the release kinetics of cTnI, a negative result within the first hours of the onset of symptoms does not rule out myocardial infarction with certainty. If myocardial infarction is still suspected, repeat the test at appropriate intervals.    No results found.  Labs:   Lab Results  Component Value Date   WBC 10.0 07/15/2015   HGB 14.0 07/15/2015   HCT 41.9 07/15/2015   MCV 86.9 07/15/2015   PLT 227 07/15/2015    Recent Labs Lab 07/15/15 1405  NA 137  K 4.3  CL 103  CO2 23  BUN 9  CREATININE 1.14*  CALCIUM 8.6*  GLUCOSE 184*    Lipid Panel  No results found for: CHOL, TRIG, HDL, CHOLHDL, VLDL, LDLCALC  BNP (last 3 results) No results for input(s): BNP in the last 8760 hours.  ProBNP (last 3 results) No results for input(s): PROBNP in the last 8760 hours.  HEMOGLOBIN A1C No results found for: HGBA1C, MPG  Cardiac Panel (last 3 results) No results for input(s): CKTOTAL, CKMB, TROPONINI, RELINDX in the last 8760 hours.  No results found for: CKTOTAL, CKMB, CKMBINDEX, TROPONINI   TSH No results for input(s): TSH in the last 8760 hours.  EKG: Sinus bradycardia at the rate of 52 beats a minute, normal axis, ST segment elevation in the inferior leads, acute injury pattern. ST depression in the anterior leads, cannot exclude true posterior infarct. Reciprocal changes also evident in 1 and aVL.Marland Kitchen   Current facility-administered medications:  .  bivalirudin (ANGIOMAX) 250 mg in sodium chloride 0.9 % 50 mL (5 mg/mL) infusion, 250 mg, , Continuous PRN, Adrian Prows, MD, Last  Rate: 17.3 mL/hr at 07/15/15 1439, 1.75 mg/kg/hr at 07/15/15 1439 .  bivalirudin (ANGIOMAX) LOAD via infusion, , , PRN, Adrian Prows, MD, 37.05 mg at 07/15/15 1437 .  fentaNYL (SUBLIMAZE) injection, , , PRN, Adrian Prows, MD, 50 mcg at 07/15/15 1450 .  heparin 1,500 mL, lidocaine (PF) (XYLOCAINE) 1 % 15 mL, nitroGLYCERIN 8 mL, , , PRN, Adrian Prows, MD .  ondansetron (ZOFRAN) injection, , , PRN, Adrian Prows, MD, 4 mg at  07/15/15 1447 .  prasugrel (EFFIENT) tablet, , , PRN, Adrian Prows, MD, 60 mg at 07/15/15 1443  Assessment/Plan 1. Acute inferior and posterior ST elevation myocardial infarction  Recommendation: Patient emergently taken to the cardiac catheterization lab to evaluate coronary anatomy.  Adrian Prows, MD 07/15/2015, 3:12 PM [facility] [place] Pager: [phone] Office: [phone] If no answer: Cell:  [phone]

## 2015-07-15 NOTE — ED Notes (Signed)
PT. Developed stabbing chest pain located center of the chest non-radiating.  Skin is pale and diaphoretic and pt. Reports dizzy , lightheaded and nauseated,  Paramedics reports HR. 48-52, BP 126/82.    Chest pain is 9/10  Hx of stents and MI 10 years ago.

## 2015-07-15 NOTE — Progress Notes (Signed)
Arrived to Holding area w/ Angiomax drip infusing at [facility]

## 2015-07-16 ENCOUNTER — Encounter: Payer: Self-pay | Admitting: *Deleted

## 2015-07-16 ENCOUNTER — Encounter (HOSPITAL_COMMUNITY): Payer: Self-pay | Admitting: Cardiology

## 2015-07-16 LAB — BASIC METABOLIC PANEL
Anion gap: 5 (ref 5–15)
BUN: 7 mg/dL (ref 6–20)
CO2: 29 mmol/L (ref 22–32)
Calcium: 9.1 mg/dL (ref 8.9–10.3)
Chloride: 104 mmol/L (ref 101–111)
Creatinine, Ser: 1.06 mg/dL — ABNORMAL HIGH (ref 0.44–1.00)
GFR calc Af Amer: >60 mL/min (ref >60)
GFR calc non Af Amer: 60 mL/min — ABNORMAL LOW (ref >60)
Glucose, Bld: 165 mg/dL — ABNORMAL HIGH (ref 65–99)
Potassium: 5.3 mmol/L — ABNORMAL HIGH (ref 3.5–5.1)
SODIUM: 138 mmol/L (ref 135–145)

## 2015-07-16 LAB — CBC
HCT: 39.8 % (ref 36.0–46.0)
Hemoglobin: 13.4 g/dL (ref 12.0–15.0)
MCH: 29.4 pg (ref 26.0–34.0)
MCHC: 33.7 g/dL (ref 30.0–36.0)
MCV: 87.3 fL (ref 78.0–100.0)
PLATELETS: 199 10*3/uL (ref 150–400)
RBC: 4.56 MIL/uL (ref 3.87–5.11)
RDW: 14.5 % (ref 11.5–15.5)
WBC: 9.3 10*3/uL (ref 4.0–10.5)

## 2015-07-16 LAB — TROPONIN I
Troponin I: >65 ng/mL (ref <0.031)
Troponin I: >65 ng/mL (ref <0.031)
Troponin I: 41.55 ng/mL (ref <0.031)

## 2015-07-16 LAB — GLUCOSE, CAPILLARY: GLUCOSE-CAPILLARY: 145 mg/dL — AB (ref 65–99)

## 2015-07-16 LAB — POCT ACTIVATED CLOTTING TIME: Activated Clotting Time: 484 seconds

## 2015-07-16 LAB — HEMOGLOBIN A1C
HEMOGLOBIN A1C: 7.5 % — AB (ref 4.8–5.6)
Mean Plasma Glucose: 169 mg/dL

## 2015-07-16 MED ORDER — BUPROPION HCL ER (SR) 150 MG PO TB12: 150.0000 mg | ORAL_TABLET | Freq: Two times a day (BID) | ORAL | Status: DC

## 2015-07-16 MED ORDER — LOSARTAN POTASSIUM 25 MG PO TABS: 25.0000 mg | ORAL_TABLET | Freq: Every day | ORAL | Status: AC

## 2015-07-16 MED ORDER — GLYBURIDE 2.5 MG PO TABS: 2.5000 mg | ORAL_TABLET | Freq: Every day | ORAL | Status: AC

## 2015-07-16 MED ORDER — NICOTINE 14 MG/24HR TD PT24: 14.0000 mg | MEDICATED_PATCH | Freq: Every day | TRANSDERMAL | Status: DC

## 2015-07-16 MED ORDER — PRASUGREL HCL 10 MG PO TABS: 10.0000 mg | ORAL_TABLET | ORAL | Status: DC

## 2015-07-16 MED ORDER — METOPROLOL TARTRATE 25 MG PO TABS: 12.5000 mg | ORAL_TABLET | Freq: Two times a day (BID) | ORAL | Status: DC

## 2015-07-16 MED ORDER — METOPROLOL TARTRATE 25 MG PO TABS: 25.0000 mg | ORAL_TABLET | Freq: Two times a day (BID) | ORAL | Status: DC

## 2015-07-16 MED ORDER — GLYBURIDE 2.5 MG PO TABS
2.5000 mg | ORAL_TABLET | Freq: Every day | ORAL | Status: DC
  Filled 2015-07-16: qty 1

## 2015-07-16 MED ORDER — ATORVASTATIN CALCIUM 80 MG PO TABS: 80.0000 mg | ORAL_TABLET | Freq: Every day | ORAL | Status: DC

## 2015-07-16 MED ORDER — ASPIRIN 81 MG PO CHEW: 81.0000 mg | CHEWABLE_TABLET | Freq: Every day | ORAL | Status: AC

## 2015-07-16 MED ORDER — HEART ATTACK BOUNCING BOOK
Freq: Once | Status: DC
  Filled 2015-07-16: qty 1

## 2015-07-16 NOTE — Care Management Note (Addendum)
Case Management Note  Patient Details  Name: Kristin [name] MRN: [id number] Date of Birth: October 30, 1966  Subjective/Objective: 49 y.o. F admitted with Acute Inferior ST MI. From private residence where she lives alone. Will follow for discharge needs.                    Action/Plan:   Expected Discharge Date: 07/18/2015                  Expected Discharge Plan:     In-House Referral:     Discharge planning Services  CM Consult  Post Acute Care Choice:    Choice offered to:     DME Arranged:    DME Agency:     HH Arranged:    HH Agency:     Status of Service:  In process, will continue to follow  Medicare Important Message Given:    Date Medicare IM Given:    Medicare IM give by:    Date Additional Medicare IM Given:    Additional Medicare Important Message give by:     If discussed at Long Length of Stay Meetings, dates discussed:    Additional Comments:  Yvone Neu, RN 07/16/2015, 8:56 AM

## 2015-07-16 NOTE — Progress Notes (Signed)
Dr. Jacinto Halim notified about patient's bradycardia in 40-50s. Metoprolol dose held this evening. BP stable. Troponin level also reported, although expected to be elevated.

## 2015-07-16 NOTE — Discharge Summary (Signed)
Physician Discharge Summary  Patient ID: Kristin [name] MRN: [id number] DOB/AGE: [birth year] 49 y.o.  Admit date: 07/15/2015 Discharge date: 07/16/2015  Primary Discharge Diagnosis Acute inferior and posterior myocardial infarction S/P  PTCA and stenting of proximal and mid RCA with a 3.0 x 32 mm Synergy DES. 100% stenosis reduced to 0%.   Secondary Discharge Diagnosis Diabetes mellitus type 2 uncontrolled,  HGBA1C 7.5*  Hypertension Hyperlipidemia Tobacco use disorder COPD  Significant Diagnostic Studies: Coronary angiogram 07/15/2015: LVEF 45% with inferior and inferoapical akinesis. No significant mitral regurgitation. Left main and proximal LAD soft plaque, 30-40% stenosis. Otherwise smooth and normal. Mid RCA occluded at the previously stented segment. PTCA and stenting of proximal and mid RCA with a 3.0 x 32 mm Synergy DES. 100% stenosis reduced to 0%. TIMI flow improved from 0-3.  Hospital Course:  Kristin [name] is a 49 y.o. female With history of known coronary artery disease, history of inferior myocardial infarction with mid RCA stent implantation, 3.0 x 13 mm non-DES in April 2002, history of tobacco use disorder had sudden onset of severe chest discomfort, she immediately activated the EMS on 07/15/2015. She was brought to the [facility] emergency room where she was found to have ST segment elevation in the inferior leads with reciprocal ST depression in the anterior leads. With a diagnosis of acute ST elevation myocardial infarctions was emergently to the cardiac catheterization lab.  Chest pain associated with marked nausea and diaphoresis. Patient had not seen a cardiologist in many years, not on any statins.  She underwent successful revascularization of a very large dominant RCA.  Following this her chest pain completely resolved, the following day she was able to ambulate without any dyspnea or chest pain with help of cardiac rehabilitation she has felt to be stable for  discharge.  She appears to be very motivated in smoking cessation.  Cardiac rehabilitation referral was also made.   Recommendations on discharge: aggressive risk factor altercation is indicated.  I have discussed with her regarding the findings of the left main disease and also proximal LAD disease which has significant soft plaque.  I will have a low threshold to repeat coronary angiography and possible IVUS evaluation of the disease if she has any symptoms suggestive of angina pectoris.   I'll continue to follow her on a very close basis.  I have started her on Wellbutrin for smoking cessation.  Nicotine patch was also prescribed.  Due to uncontrolled diabetes mellitus, I have started her on glyburide 2.5 mg by mouth daily.  She was also advised to follow-up with her PCP.  Discharge Exam: Blood pressure 130/65, pulse 64, temperature 97.9 F (36.6 C), temperature source Oral, resp. rate 19, height  (1.575 m), weight 51 kg (112 lb 7 oz), SpO2 97 %.   General appearance: alert, appears older than stated age, no distress and Petite. Eyes: negative findings: lids and lashes normal Neck: no carotid bruit, no JVD, supple, symmetrical, trachea midline and thyroid not enlarged, symmetric, no tenderness/mass/nodules Neck: JVP - normal, carotids 2+= without bruits Resp: clear to auscultation bilaterally and Bell-shaped chest, distant lung sounds, no obvious crackles or wheezes heard. Chest wall: no tenderness Cardio: regular rate and rhythm, S1, S2 normal, no murmur, click, rub or gallop and Distant heart sounds GI: soft, non-tender; bowel sounds normal; no masses, no organomegaly Extremities: extremities normal, atraumatic, no cyanosis or edema Pulses: Carotids no bruit, femoral pulses 2+ bilateral, popliteal and pedal pulses not felt. Right groin without hematoma  or bruit. Skin: Skin color, texture, turgor normal. No rashes or lesions Neurologic: Grossly normal  Labs:   Lab Results   Component Value Date   WBC 9.3 07/16/2015   HGB 13.4 07/16/2015   HCT 39.8 07/16/2015   MCV 87.3 07/16/2015   PLT 199 07/16/2015    Recent Labs Lab 07/16/15 0614  NA 138  K 5.3*  CL 104  CO2 29  BUN 7  CREATININE 1.06*  CALCIUM 9.1  GLUCOSE 165*    Lipid Panel     Component Value Date/Time   CHOL 192 07/15/2015 1915   TRIG 105 07/15/2015 1915   HDL 65 07/15/2015 1915   CHOLHDL 3.0 07/15/2015 1915   VLDL 21 07/15/2015 1915   LDLCALC 106* 07/15/2015 1915    HEMOGLOBIN A1C Lab Results  Component Value Date   HGBA1C 7.5* 07/15/2015   MPG 169 07/15/2015    Cardiac Panel (last 3 results)  Recent Labs  07/15/15 1915 07/16/15 0107 07/16/15 0614  TROPONINI 33.13* >65.00* >65.00*    Lab Results  Component Value Date   TROPONINI >65.00* 07/16/2015     TSH  Recent Labs  07/15/15 1915  TSH 1.130    EKG 07/15/2015:: Sinus bradycardia at the rate of 52 beats a minute, normal axis, ST segment elevation in the inferior leads, acute injury pattern. ST depression in the anterior leads, cannot exclude true posterior infarct. Reciprocal changes also evident in 1 and aVL.Marland Kitchen EKG 07/15/2015: Normal sinus rhythm at rate of 53 bpm, T wave inversion in the inferior lead, suggest subendocardial infarct versus ischemia.  FOLLOW UP PLANS AND APPOINTMENTS    Medication List    STOP taking these medications        FISH OIL PO      TAKE these medications        albuterol 108 (90 BASE) MCG/ACT inhaler  Commonly known as:  PROVENTIL HFA;VENTOLIN HFA  Inhale 2 puffs into the lungs every 6 (six) hours as needed for wheezing or shortness of breath.     aspirin 81 MG chewable tablet  Chew 1 tablet (81 mg total) by mouth daily.     atorvastatin 80 MG tablet  Commonly known as:  LIPITOR  Take 1 tablet (80 mg total) by mouth daily at 6 PM.     buPROPion 150 MG 12 hr tablet  Commonly known as:  WELLBUTRIN SR  Take 1 tablet (150 mg total) by mouth 2 (two) times daily. Start  1 tab daily inn morning for 3 days then morning and 4 pm afternoon     levothyroxine 88 MCG tablet  Commonly known as:  SYNTHROID, LEVOTHROID  Take 88 mcg by mouth daily before breakfast.     metFORMIN 500 MG tablet  Commonly known as:  GLUCOPHAGE  Take 500 mg by mouth 2 (two) times daily with a meal.     metoprolol tartrate 25 MG tablet  Commonly known as:  LOPRESSOR  Take 0.5 tablets (12.5 mg total) by mouth 2 (two) times daily.     prasugrel 10 MG Tabs tablet  Commonly known as:  EFFIENT  Take 1 tablet (10 mg total) by mouth daily.      ASK your doctor about these medications        CINNAMON PO  Take 1 tablet by mouth 3 (three) times daily.     MULTIVITAMIN PO  Take 1 tablet by mouth 2 (two) times daily.           Follow-up  Information    Follow up with Erling Conte, NP On 07/25/2015.   Specialty:  Nurse Practitioner   Why:  9 AM. Bring all medications   Contact information:   [address] [place] [address] [phone]        Yates Decamp, MD 07/16/2015, 9:09 AM  Pager: [phone] Office: [phone] If no answer: [phone]

## 2015-07-16 NOTE — Progress Notes (Signed)
DC orders received.  Patient stable with no S/S of distress.  Medication and discharge information reviewed with patient.  Patient DC home with sister. [place], Mitzi Hansen

## 2015-07-16 NOTE — Care Management (Signed)
Met with pt to discuss Effient for home use. Supplied pt with 30-day free Coupon which she can use and submitted Insurance Information to [employer]  for copay. Awaiting info on cost so that I can relay this to pt.  Her father is [id number] and I will deliver message there.
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
## 2015-09-29 ENCOUNTER — Encounter (HOSPITAL_COMMUNITY): Payer: Self-pay | Admitting: *Deleted

## 2017-05-02 ENCOUNTER — Inpatient Hospital Stay (HOSPITAL_COMMUNITY)
Admission: EM | Admit: 2017-05-02 | Discharge: 2017-05-04 | DRG: 248 | Disposition: A | Discharge status: Home / Self Care | Payer: BLUE CROSS/BLUE SHIELD | Attending: Cardiology | Admitting: Cardiology

## 2017-05-02 ENCOUNTER — Emergency Department (HOSPITAL_COMMUNITY): Payer: BLUE CROSS/BLUE SHIELD

## 2017-05-02 ENCOUNTER — Encounter (HOSPITAL_COMMUNITY): Payer: Self-pay | Admitting: Emergency Medicine

## 2017-05-02 DIAGNOSIS — E785 Hyperlipidemia, unspecified: Secondary | ICD-10-CM | POA: Diagnosis present

## 2017-05-02 DIAGNOSIS — I25110 Atherosclerotic heart disease of native coronary artery with unstable angina pectoris: Secondary | ICD-10-CM | POA: Diagnosis not present

## 2017-05-02 DIAGNOSIS — R079 Chest pain, unspecified: Secondary | ICD-10-CM | POA: Diagnosis present

## 2017-05-02 DIAGNOSIS — E1165 Type 2 diabetes mellitus with hyperglycemia: Secondary | ICD-10-CM | POA: Diagnosis present

## 2017-05-02 DIAGNOSIS — I2542 Coronary artery dissection: Secondary | ICD-10-CM | POA: Diagnosis not present

## 2017-05-02 DIAGNOSIS — J449 Chronic obstructive pulmonary disease, unspecified: Secondary | ICD-10-CM | POA: Diagnosis present

## 2017-05-02 DIAGNOSIS — R072 Precordial pain: Secondary | ICD-10-CM | POA: Diagnosis not present

## 2017-05-02 DIAGNOSIS — Z885 Allergy status to narcotic agent status: Secondary | ICD-10-CM

## 2017-05-02 DIAGNOSIS — Z955 Presence of coronary angioplasty implant and graft: Secondary | ICD-10-CM

## 2017-05-02 DIAGNOSIS — Z7902 Long term (current) use of antithrombotics/antiplatelets: Secondary | ICD-10-CM

## 2017-05-02 DIAGNOSIS — Z79899 Other long term (current) drug therapy: Secondary | ICD-10-CM

## 2017-05-02 DIAGNOSIS — Z7984 Long term (current) use of oral hypoglycemic drugs: Secondary | ICD-10-CM

## 2017-05-02 DIAGNOSIS — I10 Essential (primary) hypertension: Secondary | ICD-10-CM | POA: Diagnosis present

## 2017-05-02 DIAGNOSIS — Z7982 Long term (current) use of aspirin: Secondary | ICD-10-CM

## 2017-05-02 DIAGNOSIS — I2582 Chronic total occlusion of coronary artery: Secondary | ICD-10-CM | POA: Diagnosis present

## 2017-05-02 DIAGNOSIS — I252 Old myocardial infarction: Secondary | ICD-10-CM

## 2017-05-02 DIAGNOSIS — Z87891 Personal history of nicotine dependence: Secondary | ICD-10-CM

## 2017-05-02 LAB — CBC
HCT: 37.4 % (ref 36.0–46.0)
HEMOGLOBIN: 12.4 g/dL (ref 12.0–15.0)
MCH: 27.7 pg (ref 26.0–34.0)
MCHC: 33.2 g/dL (ref 30.0–36.0)
MCV: 83.5 fL (ref 78.0–100.0)
Platelets: 104 10*3/uL — ABNORMAL LOW (ref 150–400)
RBC: 4.48 MIL/uL (ref 3.87–5.11)
RDW: 14.2 % (ref 11.5–15.5)
WBC: 6.2 10*3/uL (ref 4.0–10.5)

## 2017-05-02 LAB — I-STAT TROPONIN, ED: Troponin i, poc: 0.01 ng/mL (ref 0.00–0.08)

## 2017-05-02 LAB — BASIC METABOLIC PANEL
ANION GAP: 9 (ref 5–15)
BUN: 13 mg/dL (ref 6–20)
CO2: 23 mmol/L (ref 22–32)
Calcium: 9.3 mg/dL (ref 8.9–10.3)
Chloride: 102 mmol/L (ref 101–111)
Creatinine, Ser: 0.98 mg/dL (ref 0.44–1.00)
GFR calc Af Amer: >60 mL/min (ref >60)
Glucose, Bld: 371 mg/dL — ABNORMAL HIGH (ref 65–99)
Potassium: 4.9 mmol/L (ref 3.5–5.1)
SODIUM: 134 mmol/L — AB (ref 135–145)

## 2017-05-02 MED ORDER — ACETAMINOPHEN 325 MG PO TABS
650.0000 mg | ORAL_TABLET | ORAL | Status: DC | PRN
  Administered 2017-05-03 – 2017-05-04 (×3): 650 mg via ORAL
  Filled 2017-05-02 (×3): qty 2

## 2017-05-02 MED ORDER — ASPIRIN 81 MG PO CHEW: 324.0000 mg | CHEWABLE_TABLET | ORAL | Status: AC

## 2017-05-02 MED ORDER — NITROGLYCERIN 0.4 MG SL SUBL: 0.4000 mg | SUBLINGUAL_TABLET | SUBLINGUAL | Status: DC | PRN

## 2017-05-02 MED ORDER — LOSARTAN POTASSIUM 25 MG PO TABS
25.0000 mg | ORAL_TABLET | Freq: Every day | ORAL | Status: DC
  Administered 2017-05-03 – 2017-05-04 (×2): 25 mg via ORAL
  Filled 2017-05-02 (×2): qty 1

## 2017-05-02 MED ORDER — LEVOTHYROXINE SODIUM 88 MCG PO TABS
88.0000 ug | ORAL_TABLET | Freq: Every day | ORAL | Status: DC
  Administered 2017-05-03 – 2017-05-04 (×2): 88 ug via ORAL
  Filled 2017-05-02 (×4): qty 1

## 2017-05-02 MED ORDER — METOPROLOL TARTRATE 12.5 MG HALF TABLET
12.5000 mg | ORAL_TABLET | Freq: Two times a day (BID) | ORAL | Status: DC
  Administered 2017-05-04: 12.5 mg via ORAL
  Filled 2017-05-02 (×3): qty 1

## 2017-05-02 MED ORDER — CLOPIDOGREL BISULFATE 75 MG PO TABS
75.0000 mg | ORAL_TABLET | Freq: Every day | ORAL | Status: DC
  Administered 2017-05-03: 75 mg via ORAL
  Filled 2017-05-02: qty 1

## 2017-05-02 MED ORDER — ASPIRIN 300 MG RE SUPP: 300.0000 mg | RECTAL | Status: AC

## 2017-05-02 MED ORDER — GLYBURIDE 2.5 MG PO TABS
2.5000 mg | ORAL_TABLET | Freq: Every day | ORAL | Status: DC
  Administered 2017-05-04: 2.5 mg via ORAL
  Filled 2017-05-02 (×2): qty 1

## 2017-05-02 MED ORDER — ADULT MULTIVITAMIN LIQUID CH
Freq: Every day | ORAL | Status: DC
  Administered 2017-05-04: 15 mL via ORAL
  Filled 2017-05-02 (×2): qty 15

## 2017-05-02 MED ORDER — METFORMIN HCL 850 MG PO TABS
850.0000 mg | ORAL_TABLET | Freq: Two times a day (BID) | ORAL | Status: DC
  Filled 2017-05-02: qty 1

## 2017-05-02 MED ORDER — ONDANSETRON HCL 4 MG/2ML IJ SOLN
4.0000 mg | Freq: Four times a day (QID) | INTRAMUSCULAR | Status: DC | PRN
  Administered 2017-05-03: 4 mg via INTRAVENOUS
  Filled 2017-05-02: qty 2

## 2017-05-02 MED ORDER — ASPIRIN EC 81 MG PO TBEC
81.0000 mg | DELAYED_RELEASE_TABLET | Freq: Every day | ORAL | Status: DC
  Administered 2017-05-03 – 2017-05-04 (×2): 81 mg via ORAL
  Filled 2017-05-02 (×2): qty 1

## 2017-05-02 MED ORDER — ATORVASTATIN CALCIUM 80 MG PO TABS: 80.0000 mg | ORAL_TABLET | Freq: Every day | ORAL | Status: DC

## 2017-05-02 MED ORDER — HEPARIN (PORCINE) IN NACL 100-0.45 UNIT/ML-% IJ SOLN
650.0000 [IU]/h | INTRAMUSCULAR | Status: DC
  Administered 2017-05-03: 650 [IU]/h via INTRAVENOUS
  Filled 2017-05-02: qty 250

## 2017-05-02 MED ORDER — HEPARIN BOLUS VIA INFUSION
3000.0000 [IU] | Freq: Once | INTRAVENOUS | Status: AC
  Administered 2017-05-03: 3000 [IU] via INTRAVENOUS
  Filled 2017-05-02: qty 3000

## 2017-05-02 MED ORDER — ALBUTEROL SULFATE (2.5 MG/3ML) 0.083% IN NEBU: 2.5000 mg | INHALATION_SOLUTION | Freq: Four times a day (QID) | RESPIRATORY_TRACT | Status: DC | PRN

## 2017-05-02 NOTE — ED Notes (Signed)
Patient transported to X-ray 

## 2017-05-02 NOTE — Progress Notes (Signed)
ANTICOAGULATION CONSULT NOTE - Initial Consult  Pharmacy Consult for Heparin Indication: chest pain/ACS  Allergies  Allergen Reactions  . Morphine And Related     Hives and itching    Patient Measurements: Height: 5\' 2"  (157.5 cm) Weight: 118 lb (53.5 kg) IBW/kg (Calculated) : 50.1  Vital Signs: Temp: 98.3 F (36.8 C) (05/28 2051) Temp Source: Oral (05/28 2051) BP: 126/77 (05/28 2200) Pulse Rate: 71 (05/28 2200)  Labs:  Recent Labs  05/02/17 2200  HGB 12.4  HCT 37.4  PLT 104*  CREATININE 0.98    Estimated Creatinine Clearance: 52.5 mL/min (by C-G formula based on SCr of 0.98 mg/dL).   Medical History: Past Medical History:  Diagnosis Date  . Coronary artery disease   . Diabetes mellitus without complication (HCC)     Medications:  No current facility-administered medications on file prior to encounter.    Current Outpatient Prescriptions on File Prior to Encounter  Medication Sig Dispense Refill  . albuterol (PROVENTIL HFA;VENTOLIN HFA) 108 (90 BASE) MCG/ACT inhaler Inhale 2 puffs into the lungs every 6 (six) hours as needed for wheezing or shortness of breath.    Marland Kitchen. aspirin 81 MG chewable tablet Chew 1 tablet (81 mg total) by mouth daily.    Marland Kitchen. atorvastatin (LIPITOR) 80 MG tablet Take 1 tablet (80 mg total) by mouth daily at 6 PM. 30 tablet 1  . glyBURIDE (DIABETA) 2.5 MG tablet Take 1 tablet (2.5 mg total) by mouth daily with breakfast. 30 tablet 1  . levothyroxine (SYNTHROID, LEVOTHROID) 88 MCG tablet Take 88 mcg by mouth daily before breakfast.    . losartan (COZAAR) 25 MG tablet Take 1 tablet (25 mg total) by mouth daily. 30 tablet 1  . metFORMIN (GLUCOPHAGE) 500 MG tablet Take 850 mg by mouth 2 (two) times daily with a meal.     . metoprolol (LOPRESSOR) 25 MG tablet Take 0.5 tablets (12.5 mg total) by mouth 2 (two) times daily. 60 tablet 1  . Multiple Vitamins-Minerals (MULTIVITAMIN PO) Take 1 tablet by mouth daily.     . [DISCONTINUED] metoprolol  tartrate (LOPRESSOR) 25 MG tablet Take 1 tablet (25 mg total) by mouth 2 (two) times daily. 60 tablet 1    Assessment: 51 y.o. female with chest pain for heparin Goal of Therapy:  Heparin level 0.3-0.7 units/ml Monitor platelets by anticoagulation protocol: Yes   Plan:  Heparin 3000 units IV bolus, then start heparin 650 units/hr Check heparin level in 8 hours.   Eddie Candlebbott, Nikky Duba [place] 05/02/2017,11:21 PM

## 2017-05-02 NOTE — ED Triage Notes (Signed)
Per EMS, pt c/o sudden tight substernal chest pressure  with no radiation that started 1830 tonight at work. Nausea noted, denies SOB. Pain was 7/10. EMS gave 324 ASA and 3 sublingual nitro with pain relief. Pt has hx of STEMI in 2002 and stent placement in 2016. Pt has hx of diabetes. EMS CBG 280. EMS VS BP originally 190/100 to 128/78. RR 18. HR 77. 97% room air.

## 2017-05-02 NOTE — ED Provider Notes (Signed)
[facility]-EMER[facility]ENCY DEPT Provider Note   CSN: [id number] Arrival date & time: 05/02/17  2035     History   Chief Complaint Chief Complaint  Patient presents with  . Chest Pain    HPI Kristin [name] is a 51 y.o. female.  HPI Patient with history of 2 prior MI's last one in 2016 necessitating stent placement presents with acute onset chest pain at 1830 evening. 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 Describes as central chest pressure with nausea. No diaphoresis or shortness of breath. Was given aspirin and nitroglycerin en route with complete improvement of patient's chest pain. She currently states she is pain-free. No new lower extremity swelling or pain. Past Medical History:  Diagnosis Date  . Coronary artery disease   . Diabetes mellitus without complication [facility])     Patient Active Problem List   Diagnosis Date Noted  . Acute MI, inferior wall, initial episode of care (HCC) 07/15/2015  . Acute ST elevation myocardial infarction (STEMI) involving right coronary artery (HCC) 07/15/2015    Past Surgical History:  Procedure Laterality Date  . CARDIAC CATHETERIZATION N/A 07/15/2015   Procedure: Left Heart Cath and Coronary Angiography;  Surgeon: Yates Decamp, MD;  Location: [facility] INVASIVE CV LAB;  Service: Cardiovascular;  Laterality: N/A;  . CARDIAC CATHETERIZATION N/A 07/15/2015   Procedure: Coronary Stent Intervention;  Surgeon: Yates Decamp, MD;  Location: [facility] INVASIVE CV LAB;  Service: Cardiovascular;  Laterality: N/A;  . PERCUTANEOUS CORONARY STENT INTERVENTION (PCI-S)  2002    OB History    No data available       Home Medications    Prior to Admission medications   Medication Sig Start Date End Date Taking? Authorizing Provider  albuterol (PROVENTIL HFA;VENTOLIN HFA) 108 (90 BASE) MCG/ACT inhaler Inhale 2 puffs into the lungs every 6 (six) hours as needed for wheezing or shortness of breath.   Yes [provider]  aspirin 81 MG chewable tablet Chew  1 tablet (81 mg total) by mouth daily. 07/16/15  Yes Yates Decamp, MD  atorvastatin (LIPITOR) 80 MG tablet Take 1 tablet (80 mg total) by mouth daily at 6 PM. 07/16/15  Yes Yates Decamp, MD  clopidogrel (PLAVIX) 75 MG tablet Take 75 mg by mouth daily.   Yes [provider]  glyBURIDE (DIABETA) 2.5 MG tablet Take 1 tablet (2.5 mg total) by mouth daily with breakfast. 07/17/15  Yes Yates Decamp, MD  levothyroxine (SYNTHROID, LEVOTHROID) 88 MCG tablet Take 88 mcg by mouth daily before breakfast.   Yes [provider]  losartan (COZAAR) 25 MG tablet Take 1 tablet (25 mg total) by mouth daily. 07/16/15  Yes Yates Decamp, MD  metFORMIN (GLUCOPHAGE) 500 MG tablet Take 850 mg by mouth 2 (two) times daily with a meal.    Yes [provider]  metoprolol (LOPRESSOR) 25 MG tablet Take 0.5 tablets (12.5 mg total) by mouth 2 (two) times daily. 07/16/15  Yes Yates Decamp, MD  Multiple Vitamins-Minerals (MULTIVITAMIN PO) Take 1 tablet by mouth daily.    Yes [provider]    Family History No family history on file.  Social History Social History  Substance Use Topics  . Smoking status: Former Smoker    Types: Cigarettes    Quit date: 05/03/2015  . Smokeless tobacco: Never Used  . Alcohol use No     Allergies   Morphine and related   Review of Systems Review of Systems  Constitutional: Negative for chills, fatigue and fever.  Respiratory: Negative for shortness of breath.   Cardiovascular: Positive for chest pain. Negative for palpitations and leg swelling.  Gastrointestinal: Positive for nausea. Negative for abdominal pain, constipation, diarrhea and vomiting.  Genitourinary: Negative for dysuria, flank pain and frequency.  Musculoskeletal: Negative for back pain, myalgias, neck pain and neck stiffness.  Neurological: Negative for dizziness, weakness, light-headedness, numbness and headaches.  All other systems reviewed and are negative.    Physical Exam Updated Vital  Signs BP 126/77   Pulse 71   Temp 98.3 F (36.8 C) (Oral)   Resp 18   Ht 5\' 2"  (1.575 m)   Wt 53.5 kg (118 lb)   SpO2 99%   BMI 21.58 kg/m   Physical Exam  Constitutional: She is oriented to person, place, and time. She appears well-developed and well-nourished. No distress.  HENT:  Head: Normocephalic and atraumatic.  Mouth/Throat: Oropharynx is clear and moist. No oropharyngeal exudate.  Eyes: EOM are normal. Pupils are equal, round, and reactive to light.  Neck: Normal range of motion. Neck supple.  Cardiovascular: Normal rate and regular rhythm.  Exam reveals no gallop and no friction rub.   No murmur heard. Pulmonary/Chest: Effort normal and breath sounds normal. No respiratory distress. She has no wheezes. She has no rales. She exhibits no tenderness.  Abdominal: Soft. Bowel sounds are normal. She exhibits no distension. There is no tenderness. There is no rebound and no guarding.  Musculoskeletal: Normal range of motion. She exhibits no edema or tenderness.  No lower extremity swelling, asymmetry or tenderness.  Neurological: She is alert and oriented to person, place, and time.  Moving all extremities without focal deficit. Sensation fully intact.  Skin: Skin is warm and dry. Capillary refill takes less than 2 seconds. No rash noted. No erythema.  Psychiatric: She has a normal mood and affect. Her behavior is normal.  Nursing note and vitals reviewed.    ED Treatments / Results  Labs (all labs ordered are listed, but only abnormal results are displayed) Labs Reviewed  BASIC METABOLIC PANEL - Abnormal; Notable for the following:       Result Value   Sodium 134 (*)    Glucose, Bld 371 (*)    All other components within normal limits  CBC  I-STAT TROPOININ, ED    EKG  EKG Interpretation  Date/Time:  Monday May 02 2017 20:46:27 EDT Ventricular Rate:  62 PR Interval:    QRS Duration: 95 QT Interval:  441 QTC Calculation: 448 R Axis:   78 Text  Interpretation:  Sinus rhythm Biatrial enlargement Borderline repolarization abnormality Baseline wander in lead(s) V3 Confirmed by Ranae PalmsYELVERTON  MD, Krystale Rinkenberger ([id number]) on 05/02/2017 8:52:05 PM       Radiology Dg Chest 2 View  Result Date: 05/02/2017 CLINICAL DATA:  Acute onset of substernal chest pressure and nausea. Initial encounter. EXAM: CHEST  2 VIEW COMPARISON:  Chest radiograph performed 03/20/2014 FINDINGS: The lungs are well-aerated and clear. There is no evidence of focal opacification, pleural effusion or pneumothorax. The heart is normal in size; the mediastinal contour is within normal limits. No acute osseous abnormalities are seen. IMPRESSION: No acute cardiopulmonary process seen. Electronically Signed   By: Roanna RaiderJeffery  Chang M.D.   On: 05/02/2017 21:27    Procedures Procedures (including critical care time)  Medications Ordered in ED Medications - No data to display   Initial Impression / Assessment and Plan / ED Course  I have reviewed the triage vital signs and the nursing notes.  Pertinent  labs & imaging results that were available during my care of the patient were reviewed by me and considered in my medical decision making (see chart for details).    Remains chest pain-free. Initial trop is neg. Discussed with Dr Jacinto Halim. Will admit pt to telemetry bed.    Final Clinical Impressions(s) / ED Diagnoses   Final diagnoses:  Precordial chest pain    New Prescriptions New Prescriptions   No medications on file     Loren Racer, MD 05/02/17 2300

## 2017-05-03 ENCOUNTER — Encounter (HOSPITAL_COMMUNITY)
Admission: EM | Disposition: A | Discharge status: Home / Self Care | Payer: Self-pay | Source: Home / Self Care | Attending: Cardiology

## 2017-05-03 ENCOUNTER — Encounter (HOSPITAL_COMMUNITY): Payer: Self-pay

## 2017-05-03 DIAGNOSIS — E1165 Type 2 diabetes mellitus with hyperglycemia: Secondary | ICD-10-CM | POA: Diagnosis present

## 2017-05-03 DIAGNOSIS — Z87891 Personal history of nicotine dependence: Secondary | ICD-10-CM | POA: Diagnosis not present

## 2017-05-03 DIAGNOSIS — I10 Essential (primary) hypertension: Secondary | ICD-10-CM | POA: Diagnosis present

## 2017-05-03 DIAGNOSIS — I2582 Chronic total occlusion of coronary artery: Secondary | ICD-10-CM | POA: Diagnosis present

## 2017-05-03 DIAGNOSIS — Z79899 Other long term (current) drug therapy: Secondary | ICD-10-CM | POA: Diagnosis not present

## 2017-05-03 DIAGNOSIS — R072 Precordial pain: Secondary | ICD-10-CM | POA: Diagnosis present

## 2017-05-03 DIAGNOSIS — Z955 Presence of coronary angioplasty implant and graft: Secondary | ICD-10-CM | POA: Diagnosis not present

## 2017-05-03 DIAGNOSIS — J449 Chronic obstructive pulmonary disease, unspecified: Secondary | ICD-10-CM | POA: Diagnosis present

## 2017-05-03 DIAGNOSIS — I252 Old myocardial infarction: Secondary | ICD-10-CM | POA: Diagnosis not present

## 2017-05-03 DIAGNOSIS — I25110 Atherosclerotic heart disease of native coronary artery with unstable angina pectoris: Secondary | ICD-10-CM | POA: Diagnosis present

## 2017-05-03 DIAGNOSIS — Z7902 Long term (current) use of antithrombotics/antiplatelets: Secondary | ICD-10-CM | POA: Diagnosis not present

## 2017-05-03 DIAGNOSIS — I2542 Coronary artery dissection: Secondary | ICD-10-CM | POA: Diagnosis not present

## 2017-05-03 DIAGNOSIS — Z7984 Long term (current) use of oral hypoglycemic drugs: Secondary | ICD-10-CM | POA: Diagnosis not present

## 2017-05-03 DIAGNOSIS — Z885 Allergy status to narcotic agent status: Secondary | ICD-10-CM | POA: Diagnosis not present

## 2017-05-03 DIAGNOSIS — E785 Hyperlipidemia, unspecified: Secondary | ICD-10-CM | POA: Diagnosis present

## 2017-05-03 DIAGNOSIS — Z7982 Long term (current) use of aspirin: Secondary | ICD-10-CM | POA: Diagnosis not present

## 2017-05-03 HISTORY — PX: LEFT HEART CATH AND CORONARY ANGIOGRAPHY: CATH118249

## 2017-05-03 HISTORY — PX: CORONARY STENT INTERVENTION: CATH118234

## 2017-05-03 LAB — HEPARIN LEVEL (UNFRACTIONATED)
Heparin Unfractionated: 0.4 IU/mL (ref 0.30–0.70)
Heparin Unfractionated: 0.35 IU/mL (ref 0.30–0.70)

## 2017-05-03 LAB — POCT ACTIVATED CLOTTING TIME
ACTIVATED CLOTTING TIME: 246 s
ACTIVATED CLOTTING TIME: 313 s
Activated Clotting Time: 230 seconds
Activated Clotting Time: 246 seconds

## 2017-05-03 LAB — HIV ANTIBODY (ROUTINE TESTING W REFLEX): HIV Screen 4th Generation wRfx: NONREACTIVE

## 2017-05-03 LAB — PROTIME-INR
INR: 1
Prothrombin Time: 13.2 seconds (ref 11.4–15.2)

## 2017-05-03 LAB — PLATELET INHIBITION P2Y12: PLATELET FUNCTION P2Y12: 247 [PRU] (ref 194–418)

## 2017-05-03 LAB — GLUCOSE, CAPILLARY: GLUCOSE-CAPILLARY: 111 mg/dL — AB (ref 65–99)

## 2017-05-03 SURGERY — LEFT HEART CATH AND CORONARY ANGIOGRAPHY
Anesthesia: LOCAL

## 2017-05-03 MED ORDER — AMIODARONE HCL IN DEXTROSE 360-4.14 MG/200ML-% IV SOLN
INTRAVENOUS | Status: AC
  Filled 2017-05-03: qty 200

## 2017-05-03 MED ORDER — BIVALIRUDIN TRIFLUOROACETATE 250 MG IV SOLR: INTRAVENOUS | Status: DC | PRN

## 2017-05-03 MED ORDER — MIDAZOLAM HCL 2 MG/2ML IJ SOLN
INTRAMUSCULAR | Status: DC | PRN
  Administered 2017-05-03 (×2): 1 mg via INTRAVENOUS

## 2017-05-03 MED ORDER — LIDOCAINE HCL (PF) 1 % IJ SOLN
INTRAMUSCULAR | Status: AC
  Filled 2017-05-03: qty 30

## 2017-05-03 MED ORDER — IOPAMIDOL (ISOVUE-370) INJECTION 76%
INTRAVENOUS | Status: DC | PRN
  Administered 2017-05-03: 160 mL via INTRA_ARTERIAL

## 2017-05-03 MED ORDER — LIDOCAINE HCL (PF) 1 % IJ SOLN
INTRAMUSCULAR | Status: DC | PRN
  Administered 2017-05-03: 2 mL

## 2017-05-03 MED ORDER — HEPARIN SODIUM (PORCINE) 1000 UNIT/ML IJ SOLN
INTRAMUSCULAR | Status: AC
  Filled 2017-05-03: qty 1

## 2017-05-03 MED ORDER — CANGRELOR TETRASODIUM 50 MG IV SOLR
INTRAVENOUS | Status: DC | PRN
  Administered 2017-05-03: 4 ug/kg/min via INTRAVENOUS

## 2017-05-03 MED ORDER — SODIUM CHLORIDE 0.9% FLUSH: 3.0000 mL | INTRAVENOUS | Status: DC | PRN

## 2017-05-03 MED ORDER — SODIUM CHLORIDE 0.9% FLUSH
3.0000 mL | Freq: Two times a day (BID) | INTRAVENOUS | Status: DC
  Administered 2017-05-03: 3 mL via INTRAVENOUS

## 2017-05-03 MED ORDER — SODIUM CHLORIDE 0.9 % IV SOLN: 250.0000 mL | INTRAVENOUS | Status: DC | PRN

## 2017-05-03 MED ORDER — SODIUM CHLORIDE 0.9 % WEIGHT BASED INFUSION: 1.0000 mL/kg/h | INTRAVENOUS | Status: DC

## 2017-05-03 MED ORDER — VERAPAMIL HCL 2.5 MG/ML IV SOLN
INTRAVENOUS | Status: AC
  Filled 2017-05-03: qty 2

## 2017-05-03 MED ORDER — NITROGLYCERIN 1 MG/10 ML FOR IR/CATH LAB
INTRA_ARTERIAL | Status: AC
  Filled 2017-05-03: qty 10

## 2017-05-03 MED ORDER — HEPARIN SODIUM (PORCINE) 1000 UNIT/ML IJ SOLN
INTRAMUSCULAR | Status: DC | PRN
  Administered 2017-05-03 (×2): 2000 [IU] via INTRAVENOUS
  Administered 2017-05-03: 4000 [IU] via INTRAVENOUS

## 2017-05-03 MED ORDER — TICAGRELOR 90 MG PO TABS
ORAL_TABLET | ORAL | Status: DC | PRN
  Administered 2017-05-03: 180 mg via ORAL

## 2017-05-03 MED ORDER — CANGRELOR TETRASODIUM 50 MG IV SOLR
INTRAVENOUS | Status: AC
  Filled 2017-05-03: qty 50

## 2017-05-03 MED ORDER — MIDAZOLAM HCL 2 MG/2ML IJ SOLN
INTRAMUSCULAR | Status: AC
  Filled 2017-05-03: qty 2

## 2017-05-03 MED ORDER — IOPAMIDOL (ISOVUE-370) INJECTION 76%
INTRAVENOUS | Status: AC
  Filled 2017-05-03: qty 50

## 2017-05-03 MED ORDER — PNEUMOCOCCAL VAC POLYVALENT 25 MCG/0.5ML IJ INJ: 0.5000 mL | INJECTION | INTRAMUSCULAR | Status: DC

## 2017-05-03 MED ORDER — TICAGRELOR 90 MG PO TABS
ORAL_TABLET | ORAL | Status: AC
  Filled 2017-05-03: qty 2

## 2017-05-03 MED ORDER — IOPAMIDOL (ISOVUE-370) INJECTION 76%
INTRAVENOUS | Status: AC
  Filled 2017-05-03: qty 100

## 2017-05-03 MED ORDER — NITROGLYCERIN 1 MG/10 ML FOR IR/CATH LAB
INTRA_ARTERIAL | Status: DC | PRN
  Administered 2017-05-03 (×3): 200 ug via INTRACORONARY

## 2017-05-03 MED ORDER — SODIUM CHLORIDE 0.9 % WEIGHT BASED INFUSION
3.0000 mL/kg/h | INTRAVENOUS | Status: DC
Start: 2017-05-04 — End: 2017-05-03

## 2017-05-03 MED ORDER — VERAPAMIL HCL 2.5 MG/ML IV SOLN
INTRA_ARTERIAL | Status: DC | PRN
  Administered 2017-05-03: 19:00:00 via INTRA_ARTERIAL

## 2017-05-03 MED ORDER — FENTANYL CITRATE (PF) 100 MCG/2ML IJ SOLN
INTRAMUSCULAR | Status: DC | PRN
  Administered 2017-05-03 (×2): 25 ug via INTRAVENOUS
  Administered 2017-05-03 (×2): 50 ug via INTRAVENOUS

## 2017-05-03 MED ORDER — HEPARIN (PORCINE) IN NACL 2-0.9 UNIT/ML-% IJ SOLN
INTRAMUSCULAR | Status: AC
  Filled 2017-05-03: qty 1000

## 2017-05-03 MED ORDER — CANGRELOR BOLUS VIA INFUSION
INTRAVENOUS | Status: DC | PRN
  Administered 2017-05-03: 1503 ug via INTRAVENOUS

## 2017-05-03 MED ORDER — FENTANYL CITRATE (PF) 100 MCG/2ML IJ SOLN
INTRAMUSCULAR | Status: AC
  Filled 2017-05-03: qty 2

## 2017-05-03 MED ORDER — HEPARIN (PORCINE) IN NACL 2-0.9 UNIT/ML-% IJ SOLN
INTRAMUSCULAR | Status: AC | PRN
  Administered 2017-05-03: 1000 mL via INTRA_ARTERIAL

## 2017-05-03 SURGICAL SUPPLY — 22 items
BALLN ANGIOSCULPT RX 3.0X6 (BALLOONS) ×2
BALLN EUPHORA RX 2.0X25 (BALLOONS) ×2
BALLN ~~LOC~~ EMERGE MR 3.0X12 (BALLOONS) ×2
BALLOON ANGIOSCULPT RX 3.0X6 (BALLOONS) IMPLANT
BALLOON EUPHORA RX 2.0X25 (BALLOONS) IMPLANT
BALLOON ~~LOC~~ EMERGE MR 3.0X12 (BALLOONS) IMPLANT
CATH OPTITORQUE TIG 4.0 5F (CATHETERS) ×1 IMPLANT
DEVICE RAD COMP TR BAND LRG (VASCULAR PRODUCTS) ×1 IMPLANT
GLIDESHEATH SLEND A-KIT 6F 20G (SHEATH) ×1 IMPLANT
GUIDE CATH RUNWAY 6FR FR4 (CATHETERS) ×1 IMPLANT
GUIDEWIRE INQWIRE 1.5J.035X260 (WIRE) IMPLANT
INQWIRE 1.5J .035X260CM (WIRE) ×2
KIT ENCORE 26 ADVANTAGE (KITS) ×1 IMPLANT
KIT HEART LEFT (KITS) ×2 IMPLANT
PACK CARDIAC CATHETERIZATION (CUSTOM PROCEDURE TRAY) ×2 IMPLANT
STENT PROMUS PREM MR 2.5X20 (Permanent Stent) ×1 IMPLANT
STENT PROMUS PREM MR 2.5X38 (Permanent Stent) ×2 IMPLANT
STENT PROMUS PREM MR 3.0X28 (Permanent Stent) ×1 IMPLANT
TRANSDUCER W/STOPCOCK (MISCELLANEOUS) ×2 IMPLANT
TUBING CIL FLEX 10 FLL-RA (TUBING) ×2 IMPLANT
WIRE COUGAR XT STRL 190CM (WIRE) ×1 IMPLANT
WIRE HI TORQ VERSACORE-J 145CM (WIRE) ×1 IMPLANT

## 2017-05-03 NOTE — Interval H&P Note (Signed)
History and Physical Interval Note:  05/03/2017 6:31 PM  Kristin OsgoodGracie N [name]  has presented today for surgery, with the diagnosis of unstable angina  The various methods of treatment have been discussed with the patient and family. After consideration of risks, benefits and other options for treatment, the patient has consented to  Procedure(s): Left Heart Cath and Coronary Angiography (N/A) and possible angioplasty as a surgical intervention .  The patient's history has been reviewed, patient examined, no change in status, stable for surgery.  I have reviewed the patient's chart and labs.  Questions were answered to the patient's satisfaction.   Cath Lab Visit (complete for each Cath Lab visit)  Clinical Evaluation Leading to the Procedure:   ACS: Yes.    Non-ACS:    Anginal Classification: CCS IV  Anti-ischemic medical therapy: Minimal Therapy (1 class of medications)  Non-Invasive Test Results: No non-invasive testing performed  Prior CABG: No previous CABG  Yates DecampGANJI, Halana Deisher

## 2017-05-03 NOTE — ED Notes (Signed)
Pt ambulated to bathroom. Pt denies pain at this time.

## 2017-05-03 NOTE — H&P (Signed)
Kristin [name] is an 51 y.o. female.   Chief Complaint: Chest pain HPI: Kristin [name]  is a 51 y.o. female  With She has history of known coronary artery disease and has had non-drug-eluting stent implantation to the mid RCA in 2002, history tobacco use disorder, hypertension, hyperlipidemia, diabetes mellitus and has had inferior wall myocardial infarction on 07/15/2015 when she underwent repeat and plasty to the occluded mid RCA stent by implantation of a drug-eluting stent. She been doing well until last night while at work, suddenly started having midsternal chest tightness. It was severe, she took 2 aspirins with partial relief. Then she started having nausea, hence felt that she may be having another episode of myocardial infarction, EMS was called and patient was transported to the emergency room. On route to the emergency room, patient did receive additional aspirin along with sublingual with complete relief of pain with no recurrence. She was further admitted to the hospital with a diagnosis of unstable angina pectoris.  She is presently asymptomatic, except for cramping in her legs at night no other specific symptoms. She has remained abstinent from tobacco use. Denies any headache, recurrence of nausea 1, no dark stools or bloody stools. She has not been able to tolerate Repatha for hyperlipidemia due to recurrent yeast infection. Unable to tolerate statin due to severe myalgias.  Past Medical History:  Diagnosis Date  . Coronary artery disease   . Diabetes mellitus without complication [facility])     Past Surgical History:  Procedure Laterality Date  . CARDIAC CATHETERIZATION N/A 07/15/2015   Procedure: Left Heart Cath and Coronary Angiography;  Surgeon: Adrian Prows, MD;  Location: [place] CV LAB;  Service: Cardiovascular;  Laterality: N/A;  . CARDIAC CATHETERIZATION N/A 07/15/2015   Procedure: Coronary Stent Intervention;  Surgeon: Adrian Prows, MD;  Location: [place] CV LAB;  Service:  Cardiovascular;  Laterality: N/A;  . PERCUTANEOUS CORONARY STENT INTERVENTION (PCI-S)  2002    No family history on file. Social History:  reports that she quit smoking about 2 years ago. Her smoking use included Cigarettes. She has never used smokeless tobacco. She reports that she does not drink alcohol. Her drug history is not on file.  Allergies:  Allergies  Allergen Reactions  . Morphine And Related     Hives and itching    Review of Systems -  Has chronic dyspnea that this remains stable. Denies symptoms of claudication. States that diabetes is well controlled. No neurologic deficits. No visual disturbances. No recent weight changes. No hemoptysis or leg edema. Other systems negative.  Blood pressure 103/63, pulse 62, temperature 98.4 F (36.9 C), temperature source Oral, resp. rate 18, height 5' 2"  (1.575 m), weight 50.1 kg (110 lb 8 oz), SpO2 100 %. Body mass index is 20.21 kg/m.  General appearance: alert, cooperative, appears stated age and no distress Eyes: negative findings: lids and lashes normal Neck: no carotid bruit, no JVD, supple, symmetrical, trachea midline and thyroid not enlarged, symmetric, no tenderness/mass/nodules Neck: JVP - normal, carotids 2+= without bruits Resp: Prolonged expiration, mild expiratory wheeze present. Chest wall: no tenderness Cardio: regular rate and rhythm, S1, S2 normal, no murmur, click, rub or gallop and Distant heart sounds. GI: soft, non-tender; bowel sounds normal; no masses,  no organomegaly Extremities: extremities normal, atraumatic, no cyanosis or edema Pulses: 2+ and symmetric Skin: Skin color, texture, turgor normal. No rashes or lesions Neurologic: Grossly normal  Results for orders placed or performed during the hospital encounter of 05/02/17 (  from the past 48 hour(s))  Basic metabolic panel     Status: Abnormal   Collection Time: 05/02/17 10:00 PM  Result Value Ref Range   Sodium 134 (L) 135 - 145 mmol/L   Potassium  4.9 3.5 - 5.1 mmol/L    Comment: SPECIMEN HEMOLYZED. HEMOLYSIS MAY AFFECT INTEGRITY OF RESULTS.   Chloride 102 101 - 111 mmol/L   CO2 23 22 - 32 mmol/L   Glucose, Bld 371 (H) 65 - 99 mg/dL   BUN 13 6 - 20 mg/dL   Creatinine, Ser 0.98 0.44 - 1.00 mg/dL   Calcium 9.3 8.9 - 10.3 mg/dL   GFR calc non Af Amer >60 >60 mL/min   GFR calc Af Amer >60 >60 mL/min    Comment: (NOTE) The eGFR has been calculated using the CKD EPI equation. This calculation has not been validated in all clinical situations. eGFR's persistently <60 mL/min signify possible Chronic Kidney Disease.    Anion gap 9 5 - 15  CBC     Status: Abnormal   Collection Time: 05/02/17 10:00 PM  Result Value Ref Range   WBC 6.2 4.0 - 10.5 K/uL   RBC 4.48 3.87 - 5.11 MIL/uL   Hemoglobin 12.4 12.0 - 15.0 g/dL   HCT 37.4 36.0 - 46.0 %   MCV 83.5 78.0 - 100.0 fL   MCH 27.7 26.0 - 34.0 pg   MCHC 33.2 30.0 - 36.0 g/dL   RDW 14.2 11.5 - 15.5 %   Platelets 104 (L) 150 - 400 K/uL    Comment: SPECIMEN CHECKED FOR CLOTS REPEATED TO VERIFY PLATELET COUNT CONFIRMED BY SMEAR   I-stat troponin, ED     Status: None   Collection Time: 05/02/17 10:10 PM  Result Value Ref Range   Troponin i, poc 0.01 0.00 - 0.08 ng/mL   Comment 3            Comment: Due to the release kinetics of cTnI, a negative result within the first hours of the onset of symptoms does not rule out myocardial infarction with certainty. If myocardial infarction is still suspected, repeat the test at appropriate intervals.     Labs:   Lab Results  Component Value Date   WBC 6.2 05/02/2017   HGB 12.4 05/02/2017   HCT 37.4 05/02/2017   MCV 83.5 05/02/2017   PLT 104 (L) 05/02/2017    Recent Labs Lab 05/02/17 2200  NA 134*  K 4.9  CL 102  CO2 23  BUN 13  CREATININE 0.98  CALCIUM 9.3  GLUCOSE 371*    Lipid Panel     Component Value Date/Time   CHOL 192 07/15/2015 1915   TRIG 105 07/15/2015 1915   HDL 65 07/15/2015 1915   CHOLHDL 3.0 07/15/2015  1915   VLDL 21 07/15/2015 1915   LDLCALC 106 (H) 07/15/2015 1915    BNP (last 3 results) No results for input(s): BNP in the last 8760 hours.  HEMOGLOBIN A1C Lab Results  Component Value Date   HGBA1C 7.5 (H) 07/15/2015   MPG 169 07/15/2015    Medications Prior to Admission  Medication Sig Dispense Refill  . albuterol (PROVENTIL HFA;VENTOLIN HFA) 108 (90 BASE) MCG/ACT inhaler Inhale 2 puffs into the lungs every 6 (six) hours as needed for wheezing or shortness of breath.    Marland Kitchen aspirin 81 MG chewable tablet Chew 1 tablet (81 mg total) by mouth daily.    Marland Kitchen atorvastatin (LIPITOR) 80 MG tablet Take 1 tablet (80 mg total) by  mouth daily at 6 PM. 30 tablet 1  . clopidogrel (PLAVIX) 75 MG tablet Take 75 mg by mouth daily.    Marland Kitchen glyBURIDE (DIABETA) 2.5 MG tablet Take 1 tablet (2.5 mg total) by mouth daily with breakfast. 30 tablet 1  . levothyroxine (SYNTHROID, LEVOTHROID) 88 MCG tablet Take 88 mcg by mouth daily before breakfast.    . losartan (COZAAR) 25 MG tablet Take 1 tablet (25 mg total) by mouth daily. 30 tablet 1  . metFORMIN (GLUCOPHAGE) 500 MG tablet Take 850 mg by mouth 2 (two) times daily with a meal.     . metoprolol (LOPRESSOR) 25 MG tablet Take 0.5 tablets (12.5 mg total) by mouth 2 (two) times daily. 60 tablet 1  . Multiple Vitamins-Minerals (MULTIVITAMIN PO) Take 1 tablet by mouth daily.         Current Facility-Administered Medications:  .  acetaminophen (TYLENOL) tablet 650 mg, 650 mg, Oral, Q4H PRN, Adrian Prows, MD .  albuterol (PROVENTIL) (2.5 MG/3ML) 0.083% nebulizer solution 2.5 mg, 2.5 mg, Inhalation, Q6H PRN, Adrian Prows, MD .  aspirin chewable tablet 324 mg, 324 mg, Oral, NOW **OR** aspirin suppository 300 mg, 300 mg, Rectal, NOW, Adrian Prows, MD .  aspirin EC tablet 81 mg, 81 mg, Oral, Daily, Adrian Prows, MD .  atorvastatin (LIPITOR) tablet 80 mg, 80 mg, Oral, q1800, Adrian Prows, MD .  clopidogrel (PLAVIX) tablet 75 mg, 75 mg, Oral, Daily, Adrian Prows, MD .  glyBURIDE  (DIABETA) tablet 2.5 mg, 2.5 mg, Oral, Q breakfast, Adrian Prows, MD .  heparin ADULT infusion 100 units/mL (25000 units/260m sodium chloride 0.45%), 650 Units/hr, Intravenous, Continuous, GAdrian Prows MD, Last Rate: 6.5 mL/hr at 05/03/17 0617, 650 Units/hr at 05/03/17 0617 .  levothyroxine (SYNTHROID, LEVOTHROID) tablet 88 mcg, 88 mcg, Oral, QAC breakfast, GAdrian Prows MD .  losartan (COZAAR) tablet 25 mg, 25 mg, Oral, Daily, GAdrian Prows MD .  metFORMIN (GLUCOPHAGE) tablet 850 mg, 850 mg, Oral, BID WC, GAdrian Prows MD .  metoprolol tartrate (LOPRESSOR) tablet 12.5 mg, 12.5 mg, Oral, BID, GAdrian Prows MD .  multivitamin liquid, , Oral, Daily, GAdrian Prows MD .  nitroGLYCERIN (NITROSTAT) SL tablet 0.4 mg, 0.4 mg, Sublingual, Q5 Min x 3 PRN, GAdrian Prows MD .  ondansetron ([facility] injection 4 mg, 4 mg, Intravenous, Q6H PRN, GAdrian Prows MD .  [Derrill MemoON 05/04/2017] pneumococcal 23 valent vaccine (PNU-IMMUNE) injection 0.5 mL, 0.5 mL, Intramuscular, Tomorrow-1000, GAdrian Prows MD  CARDIAC STUDIES:  EKG 05/03/2017: Normal sinus rhythm. No evidence of ischemia.  Assessment/Plan 1. Chest pain suggestive of unstable angina pectoris however no EKG abnormalities. Chest pain relieved with nitroglycerin, patient presently on IV heparin, cardiac markers are negative for myocardial injury. 2. Coronary artery disease of the native vessel with angina pectoris, history of PTCA and stenting of the mid RCA on 07/15/2015 with 3.0 x 32 mm Synergy DES. Left main and proximal LAD has a 30-40% stenosis and normal LVEF at that time. Diabetes mellitus type 2 uncontrolled,  Hypertension Hyperlipidemia Tobacco use disorder has remained abstinent since 2016 COPD Chronic thrombocytopenia, etiology not known no clinical evidence of bleeding diathesis or petechiae.  Recommendation: Patient has not been able to take any statins or PC SK 9 inhibitors, has uncontrolled diabetes mellitus, has proximal LAD soft plaque and left main soft  plaque by prior angiography. Best option is to proceed with repeat coronary angiography to exclude progression of disease.  Discussed risks, benefits and alternatives of angiogram including but not limited to <1% risk  of death, stroke, MI, need for urgent surgical revascularization, renal failure, but not limited to thest. patient is willing to proceed.  Adrian Prows, MD 05/03/2017, 8:43 AM [facility] [place] Pager: [phone] Office: [phone] If no answer: Cell:  [phone]

## 2017-05-03 NOTE — Progress Notes (Signed)
Inpatient Diabetes Program Recommendations  AACE/ADA: New Consensus Statement on Inpatient Glycemic Control (2015)  Target Ranges:  Prepandial:   less than 140 mg/dL      Peak postprandial:   less than 180 mg/dL (1-2 hours)      Critically ill patients:  140 - 180 mg/dL   Lab Results  Component Value Date   GLUCAP 145 (H) 07/16/2015   HGBA1C 7.5 (H) 07/15/2015    Review of Glycemic Control  Diabetes history: DM2 Outpatient Diabetes medications: Glyburide 2.5 mg qd + Metformin 850 mg bid Current orders for Inpatient glycemic control: Glucotrol 2.5 mg qd  Inpatient Diabetes Program Recommendations:  Please consider while patient is in the hospital: -Glycemic control order set ac & hs -A1c to determine prehospital glycemic control -Novolog correction 0-9 units tid + 0-5 units hs  Thank you, Darel HongJudy E. Aline Wesche, RN, MSN, CDE  Diabetes Coordinator Inpatient Glycemic Control Team Team Pager [phone] (8am-5pm) 05/03/2017 11:59 AM

## 2017-05-03 NOTE — Progress Notes (Addendum)
ANTICOAGULATION CONSULT NOTE   Pharmacy Consult for Heparin Indication: chest pain/ACS  Allergies  Allergen Reactions  . Morphine And Related     Hives and itching    Patient Measurements: Height: 5\' 2"  (157.5 cm) Weight: 110 lb 8 oz (50.1 kg) IBW/kg (Calculated) : 50.1  Vital Signs: Temp: 98.4 F (36.9 C) (05/29 0500) Temp Source: Oral (05/29 0500) BP: 113/62 (05/29 0900) Pulse Rate: 62 (05/29 0500)  Labs:  Recent Labs  05/02/17 2200 05/03/17 0820  HGB 12.4  --   HCT 37.4  --   PLT 104*  --   HEPARINUNFRC  --  0.40  CREATININE 0.98  --     Estimated Creatinine Clearance: 52.5 mL/min (by C-G formula based on SCr of 0.98 mg/dL).   Medical History: Past Medical History:  Diagnosis Date  . Coronary artery disease   . Diabetes mellitus without complication (HCC)     Medications:  No current facility-administered medications on file prior to encounter.    Current Outpatient Prescriptions on File Prior to Encounter  Medication Sig Dispense Refill  . albuterol (PROVENTIL HFA;VENTOLIN HFA) 108 (90 BASE) MCG/ACT inhaler Inhale 2 puffs into the lungs every 6 (six) hours as needed for wheezing or shortness of breath.    Marland Kitchen. aspirin 81 MG chewable tablet Chew 1 tablet (81 mg total) by mouth daily.    Marland Kitchen. atorvastatin (LIPITOR) 80 MG tablet Take 1 tablet (80 mg total) by mouth daily at 6 PM. 30 tablet 1  . glyBURIDE (DIABETA) 2.5 MG tablet Take 1 tablet (2.5 mg total) by mouth daily with breakfast. 30 tablet 1  . levothyroxine (SYNTHROID, LEVOTHROID) 88 MCG tablet Take 88 mcg by mouth daily before breakfast.    . losartan (COZAAR) 25 MG tablet Take 1 tablet (25 mg total) by mouth daily. 30 tablet 1  . metFORMIN (GLUCOPHAGE) 500 MG tablet Take 850 mg by mouth 2 (two) times daily with a meal.     . metoprolol (LOPRESSOR) 25 MG tablet Take 0.5 tablets (12.5 mg total) by mouth 2 (two) times daily. 60 tablet 1  . Multiple Vitamins-Minerals (MULTIVITAMIN PO) Take 1 tablet by mouth  daily.       Assessment: 51 y.o. female with chest pain on heparin. Plans noted for cath -heparin level is at goal  Goal of Therapy:  Heparin level 0.3-0.7 units/ml Monitor platelets by anticoagulation protocol: Yes   Plan:  -No heparin changes needed -will confirm a heparin level later today  Harland GermanAndrew Jaliah Foody, Pharm D 05/03/2017 11:45 AM  Addendum -heparin level confirmed at goal  Plan -No heparin changes needed -Daily heparin level and CBC  Harland GermanAndrew Lesslie Mossa, Pharm D 05/03/2017 3:28 PM   e

## 2017-05-04 ENCOUNTER — Encounter (HOSPITAL_COMMUNITY): Payer: Self-pay | Admitting: Cardiology

## 2017-05-04 LAB — CBC
HCT: 36.4 % (ref 36.0–46.0)
HEMOGLOBIN: 11.7 g/dL — AB (ref 12.0–15.0)
MCH: 26.9 pg (ref 26.0–34.0)
MCHC: 32.1 g/dL (ref 30.0–36.0)
MCV: 83.7 fL (ref 78.0–100.0)
Platelets: 209 10*3/uL (ref 150–400)
RBC: 4.35 MIL/uL (ref 3.87–5.11)
RDW: 14 % (ref 11.5–15.5)
WBC: 5.7 10*3/uL (ref 4.0–10.5)

## 2017-05-04 LAB — MRSA PCR SCREENING: MRSA by PCR: POSITIVE — AB

## 2017-05-04 MED ORDER — LOVASTATIN 20 MG PO TABS: 20.0000 mg | ORAL_TABLET | Freq: Every day | ORAL | 2 refills | Status: AC

## 2017-05-04 MED ORDER — TICAGRELOR 90 MG PO TABS
90.0000 mg | ORAL_TABLET | Freq: Once | ORAL | Status: AC
  Administered 2017-05-04: 90 mg via ORAL
  Filled 2017-05-04: qty 1

## 2017-05-04 MED ORDER — TICAGRELOR 90 MG PO TABS: 90.0000 mg | ORAL_TABLET | Freq: Two times a day (BID) | ORAL | 0 refills | Status: DC

## 2017-05-04 MED ORDER — NITROGLYCERIN 0.4 MG SL SUBL: 0.4000 mg | SUBLINGUAL_TABLET | SUBLINGUAL | 2 refills | Status: AC | PRN

## 2017-05-04 MED ORDER — METFORMIN HCL 500 MG PO TABS: 850.0000 mg | ORAL_TABLET | Freq: Two times a day (BID) | ORAL | Status: AC

## 2017-05-04 NOTE — Care Management Note (Addendum)
Case Management Note  Patient Details  Name: Germain OsgoodGracie N Lovern MRN: [id number] Date of Birth: 08/16/1966  Subjective/Objective:    Pt admitted with CP                Action/Plan:   PTA independent from home.  Pt had cardiac cath this am.   Per Cardiac Rehab Pt will discharge home on Brilinta - pt confirmed that pt does not have medicare nor medicaid - CM provided both free 30day card and reduced copay card for Brilinta.  Pt states her preferred pharmacy is [facility] in [place] - CM verified that pharmacy can fill med at discharge as prescribed.   CM will continue to follow for discharge needs   Expected Discharge Date:                  Expected Discharge Plan:  Home/Self Care  In-House Referral:     Discharge planning Services  CM Consult  Post Acute Care Choice:    Choice offered to:     DME Arranged:    DME Agency:     HH Arranged:    HH Agency:     Status of Service:     If discussed at [employer] Length of Stay Meetings, dates discussed:    Additional Comments:  Cherylann ParrClaxton, Cortez Steelman S, RN 05/04/2017, 10:23 AM

## 2017-05-04 NOTE — Progress Notes (Signed)
CARDIAC REHAB PHASE I   PRE:  Rate/Rhythm:68 SR  BP:  Sitting: 134/72        SaO2: 99 RA  MODE:  Ambulation: 360 ft   POST:  Rate/Rhythm: 75 SR  BP:  Sitting: 123/80         SaO2: 99 RA  Pt ambulated 360 ft on RA, handheld assist, steady gait, tolerated well.  Pt c/o weakness/cramping in her legs, denies any other complaints, declined rest stop. Completed PCI/stent education. Reviewed risk factors, anti-platelet therapy, stent card, activity restrictions, ntg, exercise, heart healthy and diabetes diet, and phase 2 cardiac rehab. Pt verbalized understanding. Pt agrees to phase 2 cardiac rehab referral, [facility]. Pt to recliner after walk, feet elevated, call bell within reach.   [phone] Joylene GrapesEmily C Dailin Sosnowski, RN, BSN 05/04/2017 10:34 AM

## 2017-05-04 NOTE — Discharge Summary (Signed)
Physician Discharge Summary  Patient ID: MERI PELOT MRN: [id number] DOB/AGE: Nov 14, 1966 51 y.o.  Admit date: 05/02/2017 Discharge date: 05/04/2017  Primary Discharge Diagnosis Unstable angina pectoris.  Coronary artery disease of the native vessel with other forms of angina pectoris, history of PTCA and stenting of the mid RCA on 07/15/2015 with 3.0 x 32 mm Synergy DES. Coronary angio 05/03/2017: High grade stenosis (99%)distal to previously placed stent. PTCA and stenting of the mid and distal RCA with implantation of overlapping 3.0 x 28 and a 2.5 x 20 mm Promus premier DES extending into the PDA, PTCA and stenting of the PL branch of the RCA with implantation of a 2.5 x 38 mm Promus premier DES due to spiral dissection during balloon angioplasty.  Secondary Discharge Diagnosis Diabetes mellitus type 2 uncontrolled,  Hypertension Hyperlipidemia Tobacco use disorder has remained abstinent since 2016 COPD  Significant Diagnostic Studies:  Coronary angiogram 05/03/2017: Left main 10-20% stenosis, ostial LAD 20% stenosis at most 30%, moderate sized obtuse marginal the ostial 30% stenosis. Otherwise smooth and normal circumflex and LAD. Right coronary artery 3.0 x 32 mm Synergy stent showed outflow stenosis of 95-99%. Large PDA and PL branch. Smooth and normal otherwise.  Interventional data: Successful PTCA and stenting of the mid, distal RCA and PD branch of the right coronary artery and PL branch of the right coronary artery with implantation of overlapping 3.0 x 28 and a 2.5 x 20 mm Promus premier DES extending into the PDA, PTCA and stenting of the PL branch of the RCA with implantation of a 2.5 x 38 mm Promus premier DES.  Hospital Course: Patient admitted to the hospital with  Symptoms suggestive of unstable angina via  EMS.  She was ruled out for myocardial infarctions but underwent coronary angiography the following day, revealing high-grade stenosis in the out flow through previously  placed stent.  After balloon angioplasty, patient had spiral dissection and significant amount of thrombus burden, leading to acute occlusion of the PDA and PL branch for which she underwent revascularization by placement of stents both in the PL branch and PDA along with mid and distal RCA.  Had excellent results post angioplasty and EKG completely normalized.  The following morning as she remained completely asymptomatic and tolerating all her medications well, felt stable for discharge.  Recommendations on discharge: Patient has not been able to tolerate statins due to severe myalgias.  I have started her on low dose of lovastatin at 20 mg daily, she could not tolerate PCSK 9 either due to recurrent yeast infection.  I have taken her out of work for 2 weeks.  I'll see her back in one week to 10 days for close follow-up and monitoring.  Long-term dual antiplatelet therapy is probably indicated due to significant amount of stents placed.  Patient is aware of this.  She needs better control of diabetes.  She remains abstinent from tobacco use.  Discharge Exam: Blood pressure 102/71, pulse (!) 50, temperature 98 F (36.7 C), temperature source Oral, resp. rate 13, height 5\' 2"  (1.575 m), weight 50 kg (110 lb 3.7 oz), SpO2 97 %.  General appearance: alert, cooperative, appears stated age and no distress Eyes: negative findings: lids and lashes normal Neck: no carotid bruit, no JVD, supple, symmetrical, trachea midline and thyroid not enlarged, symmetric, no tenderness/mass/nodules Neck: JVP - normal, carotids 2+= without bruits Resp: Prolonged expiration, No wheezing, clear breath sounds. Chest wall: no tenderness Cardio: regular rate and rhythm, S1, S2 normal, no murmur,  click, rub or gallop and Distant heart sounds. GI: soft, non-tender; bowel sounds normal; no masses,  no organomegaly Extremities: extremities normal, atraumatic, no cyanosis or edema Pulses: 2+ and symmetric, Right radial arterial  access site without any complication.  Labs:   Lab Results  Component Value Date   WBC 5.7 05/04/2017   HGB 11.7 (L) 05/04/2017   HCT 36.4 05/04/2017   MCV 83.7 05/04/2017   PLT 209 05/04/2017    Recent Labs Lab 05/02/17 2200  NA 134*  K 4.9  CL 102  CO2 23  BUN 13  CREATININE 0.98  CALCIUM 9.3  GLUCOSE 371*    Lipid Panel     Component Value Date/Time   CHOL 192 07/15/2015 1915   TRIG 105 07/15/2015 1915   HDL 65 07/15/2015 1915   CHOLHDL 3.0 07/15/2015 1915   VLDL 21 07/15/2015 1915   LDLCALC 106 (H) 07/15/2015 1915   HEMOGLOBIN A1C Lab Results  Component Value Date   HGBA1C 7.5 (H) 07/15/2015   MPG 169 07/15/2015   EKG 05/04/2017: Normal sinus rhythm, normal axis. No evidence skin, normal EKG.  Radiology: Dg Chest 2 View  Result Date: 05/02/2017 CLINICAL DATA:  Acute onset of substernal chest pressure and nausea. Initial encounter. EXAM: CHEST  2 VIEW COMPARISON:  Chest radiograph performed 03/20/2014 FINDINGS: The lungs are well-aerated and clear. There is no evidence of focal opacification, pleural effusion or pneumothorax. The heart is normal in size; the mediastinal contour is within normal limits. No acute osseous abnormalities are seen. IMPRESSION: No acute cardiopulmonary process seen. Electronically Signed   By: Roanna RaiderJeffery  Chang M.D.   On: 05/02/2017 21:27    FOLLOW UP PLANS AND APPOINTMENTS Discharge Instructions    Amb Referral to Cardiac Rehabilitation    Complete by:  As directed    Unstable angina pectoris.   Diagnosis:   Coronary Stents Other       Allergies as of 05/04/2017      Reactions   Morphine And Related    Hives and itching      Medication List    STOP taking these medications   atorvastatin 80 MG tablet Commonly known as:  LIPITOR   clopidogrel 75 MG tablet Commonly known as:  PLAVIX     TAKE these medications   albuterol 108 (90 Base) MCG/ACT inhaler Commonly known as:  PROVENTIL HFA;VENTOLIN HFA Inhale 2 puffs  into the lungs every 6 (six) hours as needed for wheezing or shortness of breath.   aspirin 81 MG chewable tablet Chew 1 tablet (81 mg total) by mouth daily.   glyBURIDE 2.5 MG tablet Commonly known as:  DIABETA Take 1 tablet (2.5 mg total) by mouth daily with breakfast.   levothyroxine 88 MCG tablet Commonly known as:  SYNTHROID, LEVOTHROID Take 88 mcg by mouth daily before breakfast.   losartan 25 MG tablet Commonly known as:  COZAAR Take 1 tablet (25 mg total) by mouth daily.   lovastatin 20 MG tablet Commonly known as:  MEVACOR Take 1 tablet (20 mg total) by mouth daily at 6 PM.   metFORMIN 500 MG tablet Commonly known as:  GLUCOPHAGE Take 1.5 tablets (750 mg total) by mouth 2 (two) times daily with a meal. Start taking on:  05/06/2017 What changed:  how much to take  These instructions start on 05/06/2017. If you are unsure what to do until then, ask your doctor or other care provider.   metoprolol tartrate 25 MG tablet Commonly known as:  LOPRESSOR  Take 0.5 tablets (12.5 mg total) by mouth 2 (two) times daily.   MULTIVITAMIN PO Take 1 tablet by mouth daily.   nitroGLYCERIN 0.4 MG SL tablet Commonly known as:  NITROSTAT Place 1 tablet (0.4 mg total) under the tongue every 5 (five) minutes x 3 doses as needed for chest pain.   ticagrelor 90 MG Tabs tablet Commonly known as:  BRILINTA Take 1 tablet (90 mg total) by mouth 2 (two) times daily.       Yates Decamp, MD 05/04/2017, 11:13 AM  Pager: [phone] Office: [phone] If no answer: [phone]

## 2017-05-11 ENCOUNTER — Telehealth (HOSPITAL_COMMUNITY): Payer: Self-pay

## 2017-05-11 NOTE — Telephone Encounter (Signed)
Verified [employer] insurance benefits through [employer]. No Copay, Coinsurance 30%, Deductible $400.00, pt has met $9.85 Out of Pocket $800.00, pt has met $800.00 Reference #[id number].Marland Kitchen... KJ

## 2017-05-20 ENCOUNTER — Telehealth (HOSPITAL_COMMUNITY): Payer: Self-pay

## 2017-05-20 NOTE — Telephone Encounter (Signed)
Per Carlette's request, I contacted Dr. Verl DickerGanji's office to find out if patient had been seen or was scheduled for an upcoming appointment. Patient was seen on 05/09/17. I faxed a request for office notes and EKG from that visit.

## 2017-05-25 ENCOUNTER — Telehealth (HOSPITAL_COMMUNITY): Payer: Self-pay

## 2017-05-25 NOTE — Telephone Encounter (Signed)
I called and spoke to patient about scheduling for cardiac rehab. Patient declined cardiac rehab, as she is currently taking care of father. Referral canceled.

## 2017-12-05 IMAGING — DX DG CHEST 2V
2 series · 2 of 2 positions shown · non-contrast
Comparison: Chest radiograph performed 03/20/2014

CLINICAL DATA: Acute onset of substernal chest pressure and nausea.
Initial encounter.

EXAM:
CHEST  2 VIEW

[chest pa]
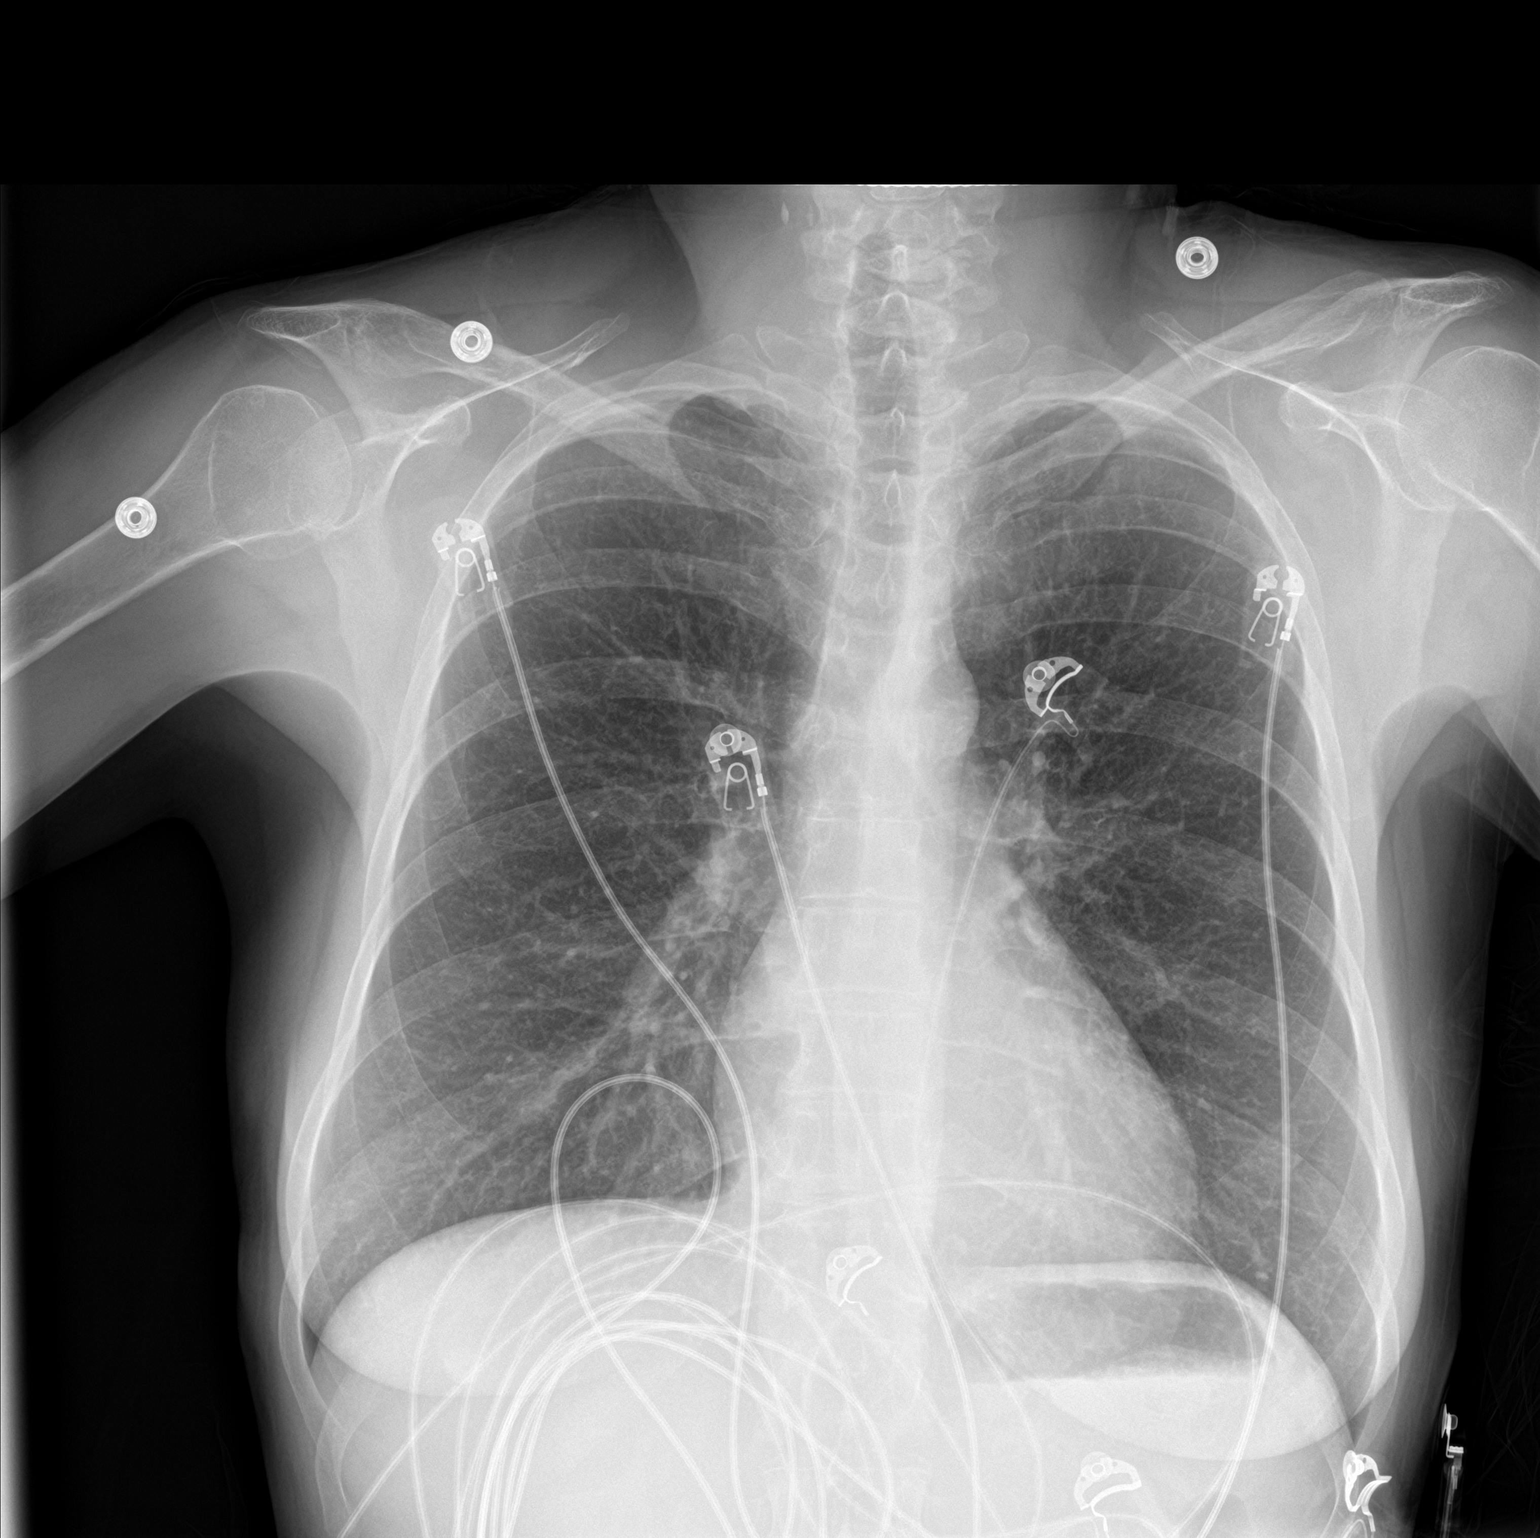

[chest lat]
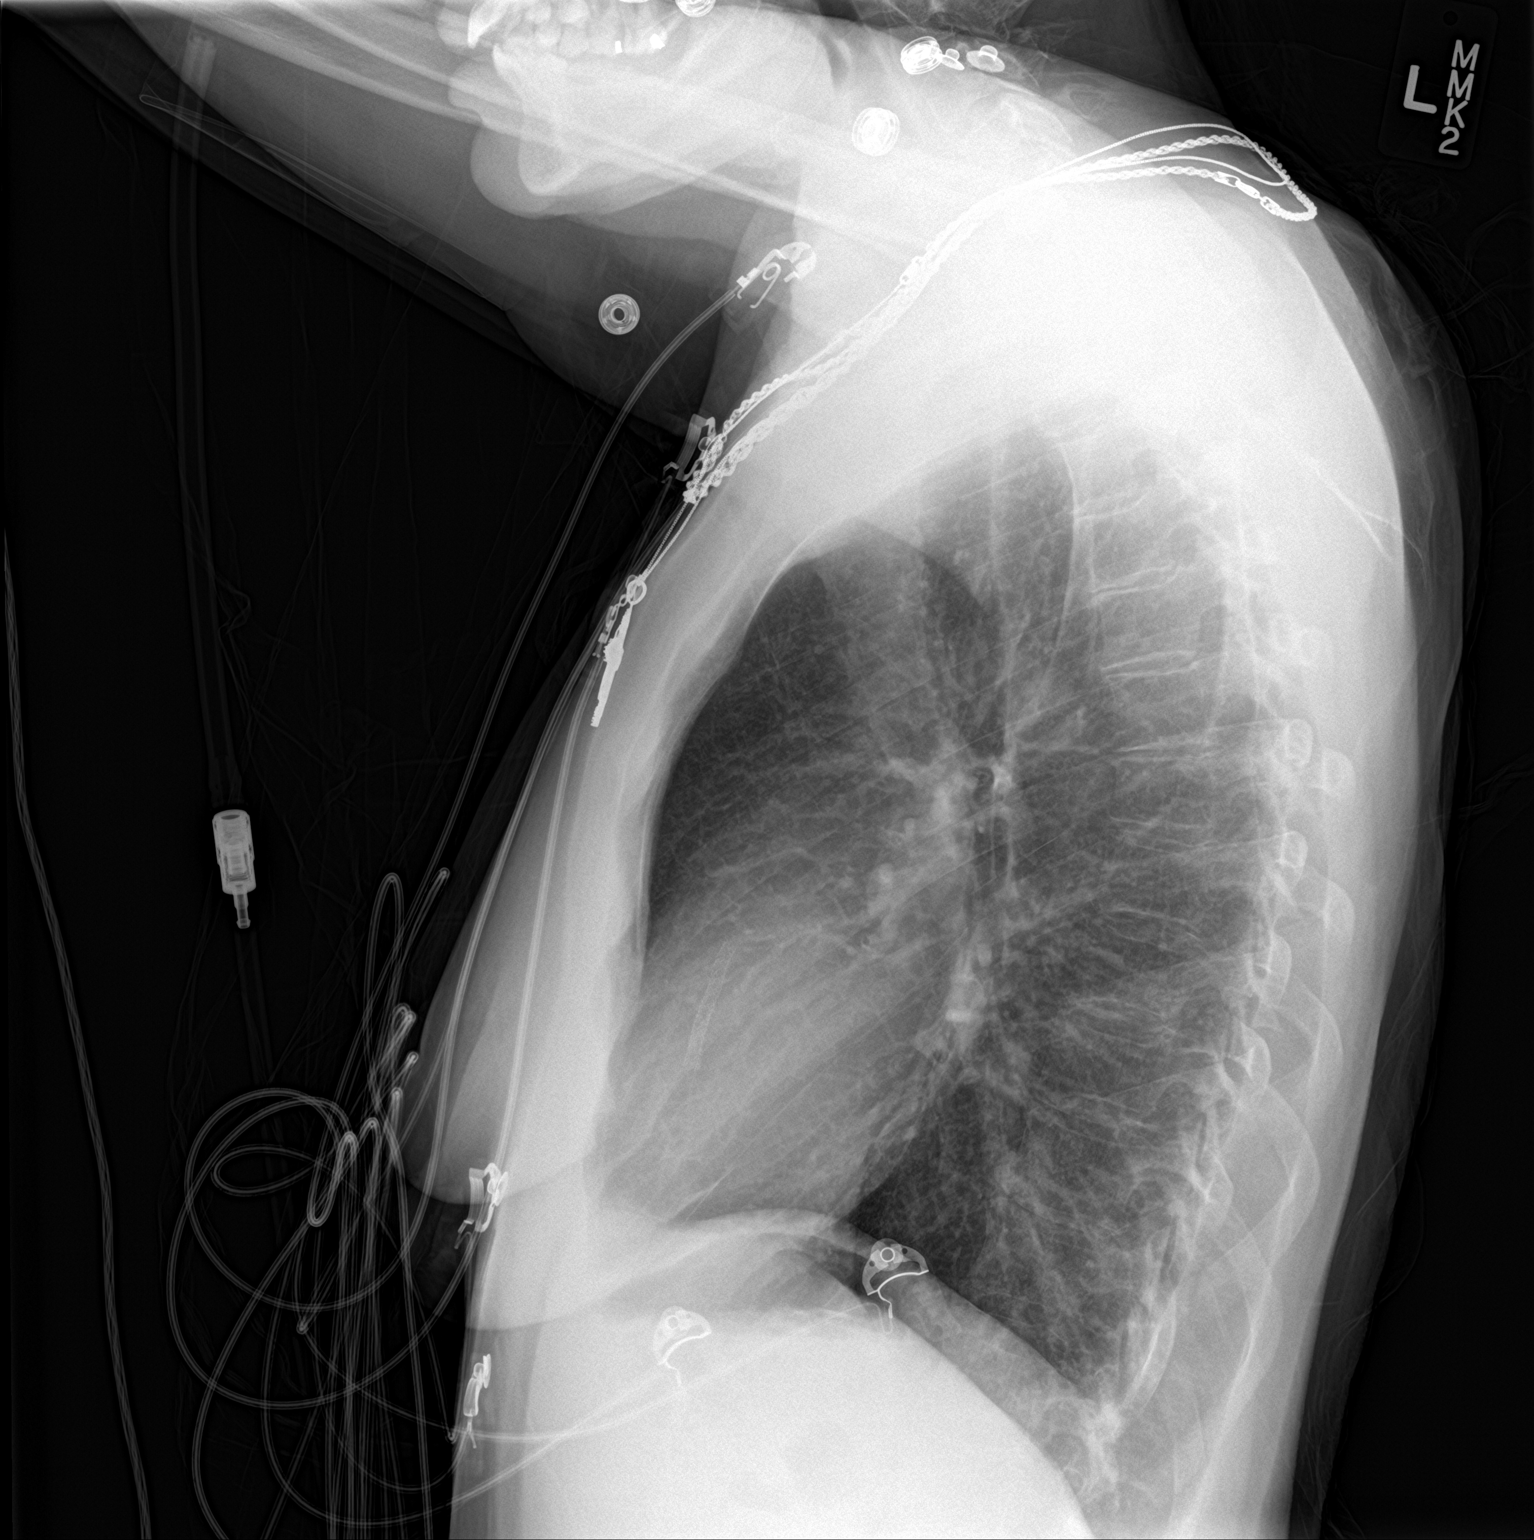

[2 of 2 positions shown; findings below may reference images not displayed]

FINDINGS: The lungs are well-aerated and clear. There is no evidence of focal
opacification, pleural effusion or pneumothorax.

The heart is normal in size; the mediastinal contour is within
normal limits. No acute osseous abnormalities are seen.
IMPRESSION: No acute cardiopulmonary process seen.

## 2019-01-18 ENCOUNTER — Ambulatory Visit: Payer: Self-pay | Admitting: Cardiology

## 2019-05-17 ENCOUNTER — Telehealth: Payer: Self-pay

## 2019-05-17 MED ORDER — JARDIANCE 10 MG PO TABS: 10.0000 mg | ORAL_TABLET | Freq: Every day | ORAL | 0 refills | Status: DC

## 2019-05-17 NOTE — Telephone Encounter (Signed)
error 

## 2019-06-25 ENCOUNTER — Other Ambulatory Visit: Payer: Self-pay | Admitting: Cardiology

## 2019-09-19 ENCOUNTER — Other Ambulatory Visit: Payer: Self-pay | Admitting: Cardiology

## 2019-11-23 ENCOUNTER — Other Ambulatory Visit: Payer: Self-pay

## 2019-11-23 MED ORDER — EZETIMIBE 10 MG PO TABS: 10.0000 mg | ORAL_TABLET | Freq: Every day | ORAL | 3 refills | Status: AC

## 2019-11-27 ENCOUNTER — Other Ambulatory Visit: Payer: Self-pay | Admitting: Cardiology

## 2020-01-01 ENCOUNTER — Other Ambulatory Visit: Payer: Self-pay | Admitting: Cardiology

## 2020-01-01 DIAGNOSIS — I2510 Atherosclerotic heart disease of native coronary artery without angina pectoris: Secondary | ICD-10-CM

## 2020-04-02 ENCOUNTER — Other Ambulatory Visit: Payer: Self-pay | Admitting: Cardiology

## 2020-11-04 ENCOUNTER — Other Ambulatory Visit: Payer: Self-pay | Admitting: Cardiology

## 2020-12-01 ENCOUNTER — Other Ambulatory Visit: Payer: Self-pay | Admitting: Cardiology

## 2020-12-16 ENCOUNTER — Other Ambulatory Visit: Payer: Self-pay | Admitting: Cardiology

## 2023-05-12 DIAGNOSIS — R001 Bradycardia, unspecified: Secondary | ICD-10-CM

## 2023-05-12 DIAGNOSIS — Z01818 Encounter for other preprocedural examination: Secondary | ICD-10-CM
